# Patient Record
Sex: Female | Born: 1960
Health system: Southern US, Community
[De-identification: ages and names within clinical notes are randomized; demographics above are authoritative.]

## PROBLEM LIST (undated history)

## (undated) DIAGNOSIS — H409 Unspecified glaucoma: Secondary | ICD-10-CM

## (undated) DIAGNOSIS — T7840XA Allergy, unspecified, initial encounter: Secondary | ICD-10-CM

## (undated) HISTORY — PX: WISDOM TOOTH EXTRACTION: SHX21

## (undated) HISTORY — PX: APPENDECTOMY: SHX54

## (undated) HISTORY — DX: Allergy, unspecified, initial encounter: T78.40XA

---

## 1997-09-18 ENCOUNTER — Inpatient Hospital Stay (HOSPITAL_COMMUNITY): Admission: AD | Admit: 1997-09-18 | Discharge: 1997-09-20 | Payer: Self-pay | Admitting: Obstetrics and Gynecology

## 1999-04-16 ENCOUNTER — Other Ambulatory Visit: Admission: RE | Admit: 1999-04-16 | Discharge: 1999-04-16 | Payer: Self-pay | Admitting: Obstetrics and Gynecology

## 2000-02-07 ENCOUNTER — Ambulatory Visit (HOSPITAL_COMMUNITY): Admission: RE | Admit: 2000-02-07 | Discharge: 2000-02-07 | Payer: Self-pay | Admitting: Radiology

## 2000-02-07 ENCOUNTER — Encounter: Payer: Self-pay | Admitting: Radiology

## 2000-02-13 ENCOUNTER — Ambulatory Visit (HOSPITAL_COMMUNITY): Admission: RE | Admit: 2000-02-13 | Discharge: 2000-02-13 | Payer: Self-pay | Admitting: Obstetrics and Gynecology

## 2000-04-08 ENCOUNTER — Encounter: Payer: Self-pay | Admitting: Obstetrics and Gynecology

## 2000-04-08 ENCOUNTER — Ambulatory Visit (HOSPITAL_COMMUNITY): Admission: RE | Admit: 2000-04-08 | Discharge: 2000-04-08 | Payer: Self-pay | Admitting: Obstetrics and Gynecology

## 2000-05-20 ENCOUNTER — Other Ambulatory Visit: Admission: RE | Admit: 2000-05-20 | Discharge: 2000-05-20 | Payer: Self-pay | Admitting: Obstetrics and Gynecology

## 2001-05-25 ENCOUNTER — Ambulatory Visit (HOSPITAL_COMMUNITY): Admission: RE | Admit: 2001-05-25 | Discharge: 2001-05-25 | Payer: Self-pay | Admitting: Internal Medicine

## 2001-05-25 ENCOUNTER — Encounter: Payer: Self-pay | Admitting: Internal Medicine

## 2001-06-13 ENCOUNTER — Ambulatory Visit (HOSPITAL_COMMUNITY): Admission: RE | Admit: 2001-06-13 | Discharge: 2001-06-13 | Payer: Self-pay | Admitting: Neurology

## 2001-06-13 ENCOUNTER — Encounter: Payer: Self-pay | Admitting: Neurology

## 2001-06-15 ENCOUNTER — Other Ambulatory Visit: Admission: RE | Admit: 2001-06-15 | Discharge: 2001-06-15 | Payer: Self-pay | Admitting: Obstetrics and Gynecology

## 2002-01-28 ENCOUNTER — Ambulatory Visit (HOSPITAL_COMMUNITY): Admission: RE | Admit: 2002-01-28 | Discharge: 2002-01-28 | Payer: Self-pay | Admitting: Internal Medicine

## 2002-01-28 ENCOUNTER — Encounter: Payer: Self-pay | Admitting: Internal Medicine

## 2002-06-29 ENCOUNTER — Other Ambulatory Visit: Admission: RE | Admit: 2002-06-29 | Discharge: 2002-06-29 | Payer: Self-pay | Admitting: Obstetrics and Gynecology

## 2003-03-22 ENCOUNTER — Ambulatory Visit (HOSPITAL_COMMUNITY): Admission: RE | Admit: 2003-03-22 | Discharge: 2003-03-22 | Payer: Self-pay | Admitting: Obstetrics and Gynecology

## 2003-03-22 ENCOUNTER — Encounter: Payer: Self-pay | Admitting: Obstetrics and Gynecology

## 2003-10-24 ENCOUNTER — Other Ambulatory Visit: Admission: RE | Admit: 2003-10-24 | Discharge: 2003-10-24 | Payer: Self-pay | Admitting: Obstetrics and Gynecology

## 2004-04-25 ENCOUNTER — Ambulatory Visit (HOSPITAL_COMMUNITY): Admission: RE | Admit: 2004-04-25 | Discharge: 2004-04-25 | Payer: Self-pay | Admitting: Obstetrics and Gynecology

## 2005-06-18 ENCOUNTER — Ambulatory Visit (HOSPITAL_COMMUNITY): Admission: RE | Admit: 2005-06-18 | Discharge: 2005-06-18 | Payer: Self-pay | Admitting: Obstetrics and Gynecology

## 2005-10-18 ENCOUNTER — Ambulatory Visit: Payer: Self-pay | Admitting: Internal Medicine

## 2006-01-08 ENCOUNTER — Ambulatory Visit: Payer: Self-pay | Admitting: Internal Medicine

## 2006-07-28 ENCOUNTER — Ambulatory Visit (HOSPITAL_COMMUNITY): Admission: RE | Admit: 2006-07-28 | Discharge: 2006-07-28 | Payer: Self-pay | Admitting: Obstetrics and Gynecology

## 2007-08-26 ENCOUNTER — Ambulatory Visit (HOSPITAL_COMMUNITY): Admission: RE | Admit: 2007-08-26 | Discharge: 2007-08-26 | Payer: Self-pay | Admitting: Obstetrics and Gynecology

## 2008-05-05 ENCOUNTER — Ambulatory Visit: Payer: Self-pay | Admitting: Internal Medicine

## 2008-10-28 ENCOUNTER — Ambulatory Visit (HOSPITAL_COMMUNITY): Admission: RE | Admit: 2008-10-28 | Discharge: 2008-10-28 | Payer: Self-pay | Admitting: Obstetrics and Gynecology

## 2009-12-20 ENCOUNTER — Ambulatory Visit (HOSPITAL_COMMUNITY): Admission: RE | Admit: 2009-12-20 | Discharge: 2009-12-20 | Payer: Self-pay | Admitting: Obstetrics and Gynecology

## 2010-08-12 ENCOUNTER — Encounter: Payer: Self-pay | Admitting: Obstetrics and Gynecology

## 2010-12-07 NOTE — Op Note (Signed)
[facility]  Patient:    Jody Duncan, Jody Duncan                      MRN: [id number] Proc. Date: 02/13/00 Adm. Date:  [id number] Disc. Date: [id number] Attending:  Morene Antu                           Operative Report  PREOPERATIVE DIAGNOSIS:  Desire for sterilization.  POSTOPERATIVE DIAGNOSIS:  Desire for sterilization.  OPERATION:  Laparoscopic tubal cautery.  SURGEON:  Sherry A. Rosalio Macadamia, M.D.  ANESTHESIA:  General.  INDICATIONS:  The patient is a 50 year old G4, P4-0-0-4 woman who requests permanent sterilization procedure.  The patient understands the risks involved as well as the alternatives to the procedure and the failure rate.  FINDINGS:  Normal-sized anteflexed uterus, normal fallopian tubes and ovaries.  DESCRIPTION OF PROCEDURE:  The patient was brought to the operating room and given adequate general anesthesia.  She was placed in a dorsal lithotomy position.  Her abdomen and vagina were washed with Betadine.  A pelvic examination was performed.  Speculum was placed in the vagina.  A single toothed Hulka tenaculum was placed in the endocervical canal in anteflexed fashion.  Speculum was removed and the surgeons gown and gloves were changed. The patient was draped in sterile fashion.  Subumbilical area was infiltrated with 0.25% Marcaine.  Incision was made.  Veress needle was introduced into the peritoneal cavity.  Its placement was checked with saline.  Approximately 3L of carbon dioxide was insufflated.  The Veress needle was removed and a laparoscopic trocar was easily introduced into the peritoneal cavity.  The left superpubic incision was made after infiltrating with 0.25% Marcaine. Under direct visualization, a superpubic trocar was going to be placed but viewing this area it was seen that there were multiple blood vessels coursing across the entire anterior peritoneal surface.  It was felt that if the trocar was  placed, it was very likely that significant bleeding would occur. Therefore the decision was made to perform the tubal ligation using a single puncture technique.  Using long Kleppingers, the left fallopian tube was then grasped ____________ portion and cauterized over approximately 2 to 3 cm. There was 1 cm of normal fallopian tube between the cauterized portion of the cornua.  The same procedure was then performed on the right fallopian tube. Both fallopian tubes were definitively identified.  Both ovaries were visualized. The cul-de-sac was normal.  Upper abdomen was normal.  There were no abnormalities seen throughout the pelvis or abdomen.  Pictures were obtained.  All carbon dioxide was then allowed to escape.  The laparoscope was removed under direct visualization.  The laparoscopic sleeve was continued to be removed after all carbon dioxide had escaped.  The subumbilical incision was then closed deeply with 0 Vicryl.  The skin incisions were closed with 4-0 Vicryl in subcuticular interrupted stitches.  Band-aids were placed over the wounds.  The bladder had filled up during the surgery and therefore the bladder was in and out catheterized.  The Hulka tenaculum was removed from the vagina.  The patient was taken out of the dorsal lithotomy position.  She was awakened.  She was removed from the operating table to a stretcher in stable condition.  COMPLICATIONS:  None.  ESTIMATED BLOOD LOSS:  Less than 5 cc. DD:  02/13/00 TD:  02/15/00 Job: [id number]

## 2010-12-26 ENCOUNTER — Other Ambulatory Visit (HOSPITAL_COMMUNITY): Payer: Self-pay | Admitting: Obstetrics

## 2010-12-26 DIAGNOSIS — Z1231 Encounter for screening mammogram for malignant neoplasm of breast: Secondary | ICD-10-CM

## 2011-01-09 ENCOUNTER — Ambulatory Visit (HOSPITAL_COMMUNITY): Payer: Commercial Managed Care - PPO | Attending: Obstetrics

## 2011-01-30 ENCOUNTER — Ambulatory Visit (HOSPITAL_COMMUNITY)
Admission: RE | Admit: 2011-01-30 | Discharge: 2011-01-30 | Disposition: A | Discharge status: Home / Self Care | Payer: 59 | Source: Ambulatory Visit | Attending: Obstetrics | Admitting: Obstetrics

## 2011-01-30 DIAGNOSIS — Z1231 Encounter for screening mammogram for malignant neoplasm of breast: Secondary | ICD-10-CM | POA: Insufficient documentation

## 2011-08-12 ENCOUNTER — Other Ambulatory Visit: Payer: Self-pay | Admitting: Internal Medicine

## 2011-08-13 ENCOUNTER — Other Ambulatory Visit: Payer: Self-pay

## 2011-08-13 MED ORDER — IBUPROFEN 800 MG PO TABS: 800.0000 mg | ORAL_TABLET | Freq: Three times a day (TID) | ORAL | Status: AC | PRN

## 2011-12-31 ENCOUNTER — Other Ambulatory Visit (HOSPITAL_COMMUNITY): Payer: Self-pay | Admitting: Obstetrics

## 2011-12-31 DIAGNOSIS — Z1231 Encounter for screening mammogram for malignant neoplasm of breast: Secondary | ICD-10-CM

## 2012-01-31 ENCOUNTER — Ambulatory Visit (HOSPITAL_COMMUNITY)
Admission: RE | Admit: 2012-01-31 | Discharge: 2012-01-31 | Disposition: A | Discharge status: Home / Self Care | Payer: 59 | Source: Ambulatory Visit | Attending: Obstetrics | Admitting: Obstetrics

## 2012-01-31 DIAGNOSIS — Z1231 Encounter for screening mammogram for malignant neoplasm of breast: Secondary | ICD-10-CM | POA: Insufficient documentation

## 2012-11-11 ENCOUNTER — Other Ambulatory Visit: Payer: Self-pay | Admitting: Internal Medicine

## 2012-12-21 ENCOUNTER — Other Ambulatory Visit: Payer: Self-pay

## 2012-12-21 ENCOUNTER — Other Ambulatory Visit (HOSPITAL_COMMUNITY): Payer: Self-pay | Admitting: Obstetrics

## 2012-12-21 DIAGNOSIS — Z1231 Encounter for screening mammogram for malignant neoplasm of breast: Secondary | ICD-10-CM

## 2013-02-01 ENCOUNTER — Other Ambulatory Visit: Payer: Self-pay | Admitting: Internal Medicine

## 2013-02-01 ENCOUNTER — Ambulatory Visit
Admission: RE | Admit: 2013-02-01 | Discharge: 2013-02-01 | Disposition: A | Discharge status: Home / Self Care | Payer: 59 | Source: Ambulatory Visit

## 2013-02-01 ENCOUNTER — Ambulatory Visit
Admission: RE | Admit: 2013-02-01 | Discharge: 2013-02-01 | Disposition: A | Discharge status: Home / Self Care | Payer: 59 | Source: Ambulatory Visit | Attending: Internal Medicine | Admitting: Internal Medicine

## 2013-02-01 ENCOUNTER — Telehealth: Payer: Self-pay | Admitting: Internal Medicine

## 2013-02-01 DIAGNOSIS — N63 Unspecified lump in unspecified breast: Secondary | ICD-10-CM

## 2013-02-01 DIAGNOSIS — Z1231 Encounter for screening mammogram for malignant neoplasm of breast: Secondary | ICD-10-CM

## 2013-02-01 NOTE — Telephone Encounter (Signed)
Scheduler called and stated that the pt will need a new order before she can receive her mammogram. The pt stated that Dr. Lovell Sheehan "felt something" in her left breast, and therefor they will need a diagnostic mammogram. They will need it to state "diagnostic bi-lateral" and in the comments they will needs it state that it will need to be a 3-d diagnostic. They will also need an ultrasound of her left breast ordered. Please assist.

## 2013-02-01 NOTE — Telephone Encounter (Signed)
done

## 2013-02-03 NOTE — Progress Notes (Signed)
done

## 2013-02-03 NOTE — Progress Notes (Signed)
Left message on machine To call and discuss

## 2013-07-30 ENCOUNTER — Other Ambulatory Visit: Payer: Self-pay | Admitting: Internal Medicine

## 2013-07-30 DIAGNOSIS — N63 Unspecified lump in unspecified breast: Secondary | ICD-10-CM

## 2013-08-04 ENCOUNTER — Ambulatory Visit
Admission: RE | Admit: 2013-08-04 | Discharge: 2013-08-04 | Disposition: A | Discharge status: Home / Self Care | Payer: 59 | Source: Ambulatory Visit | Attending: Internal Medicine | Admitting: Internal Medicine

## 2013-08-04 DIAGNOSIS — N63 Unspecified lump in unspecified breast: Secondary | ICD-10-CM

## 2013-12-29 ENCOUNTER — Other Ambulatory Visit: Payer: Self-pay

## 2013-12-29 DIAGNOSIS — Z1231 Encounter for screening mammogram for malignant neoplasm of breast: Secondary | ICD-10-CM

## 2014-01-13 ENCOUNTER — Other Ambulatory Visit: Payer: Self-pay | Admitting: Internal Medicine

## 2014-02-02 ENCOUNTER — Ambulatory Visit
Admission: RE | Admit: 2014-02-02 | Discharge: 2014-02-02 | Disposition: A | Discharge status: Home / Self Care | Payer: 59 | Source: Ambulatory Visit

## 2014-02-02 DIAGNOSIS — Z1231 Encounter for screening mammogram for malignant neoplasm of breast: Secondary | ICD-10-CM

## 2014-03-22 ENCOUNTER — Other Ambulatory Visit: Payer: Self-pay | Admitting: Internal Medicine

## 2015-01-18 ENCOUNTER — Other Ambulatory Visit: Payer: Self-pay

## 2015-01-18 DIAGNOSIS — Z1231 Encounter for screening mammogram for malignant neoplasm of breast: Secondary | ICD-10-CM

## 2015-02-22 ENCOUNTER — Ambulatory Visit: Payer: 59

## 2015-04-03 ENCOUNTER — Ambulatory Visit: Payer: 59

## 2015-04-14 ENCOUNTER — Ambulatory Visit: Payer: 59

## 2015-04-14 ENCOUNTER — Ambulatory Visit
Admission: RE | Admit: 2015-04-14 | Discharge: 2015-04-14 | Disposition: A | Discharge status: Home / Self Care | Payer: 59 | Source: Ambulatory Visit

## 2015-04-14 DIAGNOSIS — Z1231 Encounter for screening mammogram for malignant neoplasm of breast: Secondary | ICD-10-CM

## 2015-08-12 ENCOUNTER — Other Ambulatory Visit: Payer: Self-pay | Admitting: Internal Medicine

## 2015-09-05 MED FILL — HYDROCHLOROTHIAZIDE 25 MG T: 25 | 90 days supply | Qty: 90 | Fill #0

## 2015-10-16 DIAGNOSIS — L821 Other seborrheic keratosis: Secondary | ICD-10-CM | POA: Diagnosis not present

## 2015-11-16 MED FILL — IBUPROFEN 800 MG TABLET: 800 | 30 days supply | Qty: 90 | Fill #0

## 2015-12-11 MED FILL — HYDROCHLOROTHIAZIDE 25 MG T: 25 | 90 days supply | Qty: 90 | Fill #1

## 2016-03-04 MED FILL — HYDROCHLOROTHIAZIDE 25 MG T: 25 | 30 days supply | Qty: 30 | Fill #0

## 2016-03-21 DIAGNOSIS — N951 Menopausal and female climacteric states: Secondary | ICD-10-CM | POA: Diagnosis not present

## 2016-03-21 DIAGNOSIS — R634 Abnormal weight loss: Secondary | ICD-10-CM | POA: Diagnosis not present

## 2016-03-21 DIAGNOSIS — L640 Drug-induced androgenic alopecia: Secondary | ICD-10-CM | POA: Diagnosis not present

## 2016-03-21 DIAGNOSIS — L659 Nonscarring hair loss, unspecified: Secondary | ICD-10-CM | POA: Diagnosis not present

## 2016-03-22 DIAGNOSIS — R002 Palpitations: Secondary | ICD-10-CM | POA: Diagnosis not present

## 2016-03-22 DIAGNOSIS — Z1389 Encounter for screening for other disorder: Secondary | ICD-10-CM | POA: Diagnosis not present

## 2016-03-22 DIAGNOSIS — N959 Unspecified menopausal and perimenopausal disorder: Secondary | ICD-10-CM | POA: Diagnosis not present

## 2016-03-22 DIAGNOSIS — Z23 Encounter for immunization: Secondary | ICD-10-CM | POA: Diagnosis not present

## 2016-03-26 ENCOUNTER — Other Ambulatory Visit: Payer: Self-pay | Admitting: Obstetrics and Gynecology

## 2016-03-26 DIAGNOSIS — Z1231 Encounter for screening mammogram for malignant neoplasm of breast: Secondary | ICD-10-CM

## 2016-04-02 MED FILL — HYDROCHLOROTHIAZIDE 25 MG T: 25 | 30 days supply | Qty: 30 | Fill #0

## 2016-04-16 ENCOUNTER — Ambulatory Visit: Payer: 59

## 2016-04-17 ENCOUNTER — Ambulatory Visit
Admission: RE | Admit: 2016-04-17 | Discharge: 2016-04-17 | Disposition: A | Discharge status: Home / Self Care | Payer: 59 | Source: Ambulatory Visit | Attending: Obstetrics and Gynecology | Admitting: Obstetrics and Gynecology

## 2016-04-17 DIAGNOSIS — Z1231 Encounter for screening mammogram for malignant neoplasm of breast: Secondary | ICD-10-CM | POA: Diagnosis not present

## 2016-04-29 MED FILL — IBUPROFEN 800 MG TABLET: 800 | 30 days supply | Qty: 90 | Fill #1

## 2016-04-29 MED FILL — HYDROCHLOROTHIAZIDE 25 MG T: 25 | 30 days supply | Qty: 30 | Fill #1

## 2016-05-27 MED FILL — HYDROCHLOROTHIAZIDE 25 MG T: 25 | 30 days supply | Qty: 30 | Fill #2

## 2016-06-24 MED FILL — HYDROCHLOROTHIAZIDE 25 MG T: 25 | 30 days supply | Qty: 30 | Fill #3

## 2016-07-23 MED FILL — HYDROCHLOROTHIAZIDE 25 MG T: 25 | 30 days supply | Qty: 30 | Fill #0

## 2016-07-26 MED FILL — IBUPROFEN 800 MG TABLET: 800 | 30 days supply | Qty: 90 | Fill #2

## 2016-08-22 MED FILL — HYDROCHLOROTHIAZIDE 25 MG T: 25 | 90 days supply | Qty: 90 | Fill #1

## 2016-09-10 DIAGNOSIS — H5203 Hypermetropia, bilateral: Secondary | ICD-10-CM | POA: Diagnosis not present

## 2016-09-10 DIAGNOSIS — H524 Presbyopia: Secondary | ICD-10-CM | POA: Diagnosis not present

## 2016-09-10 DIAGNOSIS — H52203 Unspecified astigmatism, bilateral: Secondary | ICD-10-CM | POA: Diagnosis not present

## 2016-10-14 DIAGNOSIS — M79641 Pain in right hand: Secondary | ICD-10-CM | POA: Diagnosis not present

## 2016-10-14 DIAGNOSIS — G5601 Carpal tunnel syndrome, right upper limb: Secondary | ICD-10-CM | POA: Diagnosis not present

## 2016-10-22 ENCOUNTER — Other Ambulatory Visit: Payer: Self-pay | Admitting: Internal Medicine

## 2016-10-22 MED FILL — IBUPROFEN 800 MG TABLET: 800 | 30 days supply | Qty: 90 | Fill #0

## 2016-10-24 DIAGNOSIS — G5601 Carpal tunnel syndrome, right upper limb: Secondary | ICD-10-CM | POA: Diagnosis not present

## 2016-11-18 MED FILL — HYDROCHLOROTHIAZIDE 25 MG T: 25 | 90 days supply | Qty: 90 | Fill #0

## 2016-12-06 DIAGNOSIS — Z01419 Encounter for gynecological examination (general) (routine) without abnormal findings: Secondary | ICD-10-CM | POA: Diagnosis not present

## 2016-12-06 DIAGNOSIS — Z6821 Body mass index (BMI) 21.0-21.9, adult: Secondary | ICD-10-CM | POA: Diagnosis not present

## 2017-02-13 MED FILL — IBUPROFEN 800 MG TABLET: 800 | 30 days supply | Qty: 90 | Fill #1

## 2017-02-13 MED FILL — HYDROCHLOROTHIAZIDE 25 MG T: 25 | 60 days supply | Qty: 60 | Fill #1

## 2017-03-19 ENCOUNTER — Other Ambulatory Visit: Payer: Self-pay | Admitting: Obstetrics and Gynecology

## 2017-03-19 DIAGNOSIS — Z0000 Encounter for general adult medical examination without abnormal findings: Secondary | ICD-10-CM | POA: Diagnosis not present

## 2017-03-19 DIAGNOSIS — R7309 Other abnormal glucose: Secondary | ICD-10-CM | POA: Diagnosis not present

## 2017-03-19 DIAGNOSIS — Z1231 Encounter for screening mammogram for malignant neoplasm of breast: Secondary | ICD-10-CM

## 2017-03-19 DIAGNOSIS — M859 Disorder of bone density and structure, unspecified: Secondary | ICD-10-CM | POA: Diagnosis not present

## 2017-03-19 DIAGNOSIS — Z780 Asymptomatic menopausal state: Secondary | ICD-10-CM | POA: Diagnosis not present

## 2017-03-27 DIAGNOSIS — Z0000 Encounter for general adult medical examination without abnormal findings: Secondary | ICD-10-CM | POA: Diagnosis not present

## 2017-03-27 DIAGNOSIS — Z23 Encounter for immunization: Secondary | ICD-10-CM | POA: Diagnosis not present

## 2017-03-27 DIAGNOSIS — Z1389 Encounter for screening for other disorder: Secondary | ICD-10-CM | POA: Diagnosis not present

## 2017-03-27 DIAGNOSIS — Z6821 Body mass index (BMI) 21.0-21.9, adult: Secondary | ICD-10-CM | POA: Diagnosis not present

## 2017-03-27 DIAGNOSIS — M859 Disorder of bone density and structure, unspecified: Secondary | ICD-10-CM | POA: Diagnosis not present

## 2017-04-04 DIAGNOSIS — G5601 Carpal tunnel syndrome, right upper limb: Secondary | ICD-10-CM | POA: Diagnosis not present

## 2017-04-04 DIAGNOSIS — M79641 Pain in right hand: Secondary | ICD-10-CM | POA: Diagnosis not present

## 2017-04-09 MED FILL — HYDROCHLOROTHIAZIDE 25 MG T: 25 | 30 days supply | Qty: 30 | Fill #0

## 2017-04-21 ENCOUNTER — Ambulatory Visit
Admission: RE | Admit: 2017-04-21 | Discharge: 2017-04-21 | Disposition: A | Discharge status: Home / Self Care | Payer: 59 | Source: Ambulatory Visit | Attending: Obstetrics and Gynecology | Admitting: Obstetrics and Gynecology

## 2017-04-21 DIAGNOSIS — Z1231 Encounter for screening mammogram for malignant neoplasm of breast: Secondary | ICD-10-CM | POA: Diagnosis not present

## 2017-05-09 MED FILL — HYDROCHLOROTHIAZIDE 25 MG T: 25 | 90 days supply | Qty: 90 | Fill #1

## 2017-05-20 DIAGNOSIS — D225 Melanocytic nevi of trunk: Secondary | ICD-10-CM | POA: Diagnosis not present

## 2017-05-20 DIAGNOSIS — D1801 Hemangioma of skin and subcutaneous tissue: Secondary | ICD-10-CM | POA: Diagnosis not present

## 2017-05-20 DIAGNOSIS — D235 Other benign neoplasm of skin of trunk: Secondary | ICD-10-CM | POA: Diagnosis not present

## 2017-05-20 DIAGNOSIS — L821 Other seborrheic keratosis: Secondary | ICD-10-CM | POA: Diagnosis not present

## 2017-05-26 MED FILL — IBUPROFEN 800 MG TABS: 800 | 30 days supply | Qty: 90 | Fill #2

## 2017-08-04 MED FILL — HYDROCHLOROTHIAZIDE 25 MG T: 25 | 90 days supply | Qty: 90 | Fill #0

## 2017-08-21 MED FILL — IBUPROFEN 800 MG TABS: 800 | 30 days supply | Qty: 90 | Fill #3

## 2017-09-01 DIAGNOSIS — G5601 Carpal tunnel syndrome, right upper limb: Secondary | ICD-10-CM | POA: Insufficient documentation

## 2017-09-01 DIAGNOSIS — G5602 Carpal tunnel syndrome, left upper limb: Secondary | ICD-10-CM | POA: Diagnosis not present

## 2017-10-23 MED FILL — HYDROCHLOROTHIAZIDE 25 MG T: 25 | 90 days supply | Qty: 90 | Fill #1

## 2017-10-29 DIAGNOSIS — M79641 Pain in right hand: Secondary | ICD-10-CM | POA: Insufficient documentation

## 2017-10-29 DIAGNOSIS — R200 Anesthesia of skin: Secondary | ICD-10-CM | POA: Diagnosis not present

## 2017-10-29 DIAGNOSIS — G5601 Carpal tunnel syndrome, right upper limb: Secondary | ICD-10-CM | POA: Diagnosis not present

## 2017-10-29 DIAGNOSIS — G5602 Carpal tunnel syndrome, left upper limb: Secondary | ICD-10-CM | POA: Diagnosis not present

## 2017-11-21 ENCOUNTER — Other Ambulatory Visit: Payer: Self-pay | Admitting: Internal Medicine

## 2017-11-21 MED FILL — IBUPROFEN 800 MG TAB: 800 | 30 days supply | Qty: 90 | Fill #0

## 2018-01-20 MED FILL — HYDROCHLOROTHIAZIDE 25 MG T: 25 | 30 days supply | Qty: 30 | Fill #2

## 2018-02-20 MED FILL — HYDROCHLOROTHIAZIDE 25 MG T: 25 | 90 days supply | Qty: 90 | Fill #0

## 2018-02-25 MED FILL — IBUPROFEN 800 MG TAB: 800 | 30 days supply | Qty: 90 | Fill #1

## 2018-03-09 DIAGNOSIS — Z01419 Encounter for gynecological examination (general) (routine) without abnormal findings: Secondary | ICD-10-CM | POA: Diagnosis not present

## 2018-03-09 DIAGNOSIS — Z6821 Body mass index (BMI) 21.0-21.9, adult: Secondary | ICD-10-CM | POA: Diagnosis not present

## 2018-03-18 ENCOUNTER — Ambulatory Visit (AMBULATORY_SURGERY_CENTER): Payer: Self-pay | Admitting: *Deleted

## 2018-03-18 VITALS — Ht 64.0 in | Wt 125.0 lb

## 2018-03-18 DIAGNOSIS — Z1211 Encounter for screening for malignant neoplasm of colon: Secondary | ICD-10-CM

## 2018-03-18 MED ORDER — NA SULFATE-K SULFATE-MG SULF 17.5-3.13-1.6 GM/177ML PO SOLN: ORAL | 0 refills | Status: DC

## 2018-03-18 MED FILL — SUPREP BOWEL PREP KIT: 17.5-3.13-1 | 1 days supply | Qty: 354 | Fill #0

## 2018-03-18 NOTE — Progress Notes (Signed)
No egg or soy allergy  No home oxygen used or hx of sleep apnea  No anesthesia or intubation problems  No diet medications taken 

## 2018-03-24 ENCOUNTER — Other Ambulatory Visit: Payer: Self-pay | Admitting: Obstetrics and Gynecology

## 2018-03-24 DIAGNOSIS — Z1231 Encounter for screening mammogram for malignant neoplasm of breast: Secondary | ICD-10-CM

## 2018-03-25 ENCOUNTER — Encounter: Payer: Self-pay | Admitting: Internal Medicine

## 2018-04-08 ENCOUNTER — Ambulatory Visit (AMBULATORY_SURGERY_CENTER): Payer: 59 | Admitting: Internal Medicine

## 2018-04-08 ENCOUNTER — Encounter: Payer: Self-pay | Admitting: Internal Medicine

## 2018-04-08 VITALS — BP 110/71 | HR 45 | Temp 98.6°F | Resp 16 | Ht 64.0 in | Wt 125.0 lb

## 2018-04-08 DIAGNOSIS — Z1211 Encounter for screening for malignant neoplasm of colon: Secondary | ICD-10-CM | POA: Diagnosis not present

## 2018-04-08 DIAGNOSIS — D120 Benign neoplasm of cecum: Secondary | ICD-10-CM | POA: Diagnosis not present

## 2018-04-08 DIAGNOSIS — D122 Benign neoplasm of ascending colon: Secondary | ICD-10-CM | POA: Diagnosis not present

## 2018-04-08 MED ORDER — SODIUM CHLORIDE 0.9 % IV SOLN: 500.0000 mL | Freq: Once | INTRAVENOUS | Status: AC

## 2018-04-08 NOTE — Progress Notes (Signed)
Called to room to assist during endoscopic procedure.  Patient ID and intended procedure confirmed with present staff. Received instructions for my participation in the procedure from the performing physician.  

## 2018-04-08 NOTE — Patient Instructions (Signed)
YOU HAD AN ENDOSCOPIC PROCEDURE TODAY AT [facility]:   Refer to the procedure report that was given to you for any specific questions about what was found during the examination.  If the procedure report does not answer your questions, please call your gastroenterologist to clarify.  If you requested that your care partner not be given the details of your procedure findings, then the procedure report has been included in a sealed envelope for you to review at your convenience later.  YOU SHOULD EXPECT: Some feelings of bloating in the abdomen. Passage of more gas than usual.  Walking can help get rid of the air that was put into your GI tract during the procedure and reduce the bloating. If you had a lower endoscopy (such as a colonoscopy or flexible sigmoidoscopy) you may notice spotting of blood in your stool or on the toilet paper. If you underwent a bowel prep for your procedure, you may not have a normal bowel movement for a few days.  Please Note:  You might notice some irritation and congestion in your nose or some drainage.  This is from the oxygen used during your procedure.  There is no need for concern and it should clear up in a day or so.  SYMPTOMS TO REPORT IMMEDIATELY:   Following lower endoscopy (colonoscopy or flexible sigmoidoscopy):  Excessive amounts of blood in the stool  Significant tenderness or worsening of abdominal pains  Swelling of the abdomen that is new, acute  Fever of 100F or higher  For urgent or emergent issues, a gastroenterologist can be reached at any hour by calling [phone].   DIET:  We do recommend a small meal at first, but then you may proceed to your regular diet.  Drink plenty of fluids but you should avoid alcoholic beverages for 24 hours.  ACTIVITY:  You should plan to take it easy for the rest of today and you should NOT DRIVE or use heavy machinery until tomorrow (because of the sedation medicines used during the test).     FOLLOW UP: Our staff will call the number listed on your records the next business day following your procedure to check on you and address any questions or concerns that you may have regarding the information given to you following your procedure. If we do not reach you, we will leave a message.  However, if you are feeling well and you are not experiencing any problems, there is no need to return our call.  We will assume that you have returned to your regular daily activities without incident.  If any biopsies were taken you will be contacted by phone or by letter within the next 1-3 weeks.  Please call us at [phone] if you have not heard about the biopsies in 3 weeks.    SIGNATURES/CONFIDENTIALITY: You and/or your care partner have signed paperwork which will be entered into your electronic medical record.  These signatures attest to the fact that that the information above on your After Visit Summary has been reviewed and is understood.  Full responsibility of the confidentiality of this discharge information lies with you and/or your care-partner.  Polyp and diverticulosis information given,

## 2018-04-08 NOTE — Op Note (Addendum)
[place] Patient Name: Jody Duncan Procedure Date: 04/08/2018 8:43 AM MRN: [id number] Endoscopist: Jerene Bears , MD Age: 57 Referring MD:  Date of Birth: [birth year] Gender: Female Account #: [id number] Procedure:                Colonoscopy Indications:              Screening for colorectal malignant neoplasm, This                            is the patient's first colonoscopy Medicines:                Monitored Anesthesia Care Procedure:                Pre-Anesthesia Assessment:                           - Prior to the procedure, a History and Physical                            was performed, and patient medications and                            allergies were reviewed. The patient's tolerance of                            previous anesthesia was also reviewed. The risks                            and benefits of the procedure and the sedation                            options and risks were discussed with the patient.                            All questions were answered, and informed consent                            was obtained. Prior Anticoagulants: The patient has                            taken no previous anticoagulant or antiplatelet                            agents. ASA Grade Assessment: I - A normal, healthy                            patient. After reviewing the risks and benefits,                            the patient was deemed in satisfactory condition to                            undergo the procedure.  After obtaining informed consent, the colonoscope                            was passed under direct vision. Throughout the                            procedure, the patient's blood pressure, pulse, and                            oxygen saturations were monitored continuously. The                            Colonoscope was introduced through the anus and                            advanced to the terminal ileum. The  colonoscopy was                            performed without difficulty. The patient tolerated                            the procedure well. The quality of the bowel                            preparation was excellent with SuPrep. The terminal                            ileum, ileocecal valve, appendiceal orifice, and                            rectum were photographed. Scope In: 9:03:09 AM Scope Out: 9:22:30 AM Scope Withdrawal Time: 0 hours 15 minutes 21 seconds  Total Procedure Duration: 0 hours 19 minutes 21 seconds  Findings:                 The digital rectal exam was normal.                           The terminal ileum appeared normal.                           Two sessile polyps were found in the cecum. The                            polyps were 3 to 4 mm in size. These polyps were                            removed with a cold snare. Resection and retrieval                            were complete.                           A 6 mm polyp was found in the ascending colon. The  polyp was sessile. The polyp was removed with a                            cold snare. Resection and retrieval were complete.                           A few small-mouthed diverticula were found in the                            sigmoid colon and descending colon.                           The exam was otherwise without abnormality on                            direct and retroflexion views. Complications:            No immediate complications. Estimated Blood Loss:     Estimated blood loss was minimal. Impression:               - The examined portion of the ileum was normal.                           - Two 3 to 4 mm polyps in the cecum, removed with a                            cold snare. Resected and retrieved.                           - One 6 mm polyp in the ascending colon, removed                            with a cold snare. Resected and retrieved.                            - Minimal diverticulosis in the sigmoid colon and                            in the descending colon.                           - The examination was otherwise normal on direct                            and retroflexion views. Recommendation:           - Patient has a contact number available for                            emergencies. The signs and symptoms of potential                            delayed complications were discussed with the  patient. Return to normal activities tomorrow.                            Written discharge instructions were provided to the                            patient.                           - Resume previous diet.                           - Continue present medications.                           - Await pathology results.                           - Repeat colonoscopy is recommended. The                            colonoscopy date will be determined after pathology                            results from today's exam become available for                            review. Jerene Bears, MD 04/08/2018 9:26:59 AM This report has been signed electronically.

## 2018-04-08 NOTE — Progress Notes (Signed)
To recovery, report to RN, VSS. 

## 2018-04-09 ENCOUNTER — Telehealth: Payer: Self-pay

## 2018-04-09 NOTE — Telephone Encounter (Signed)
  Follow up Call-  Call back number 04/08/2018  Post procedure Call Back phone  # [phone]  Permission to leave phone message Yes  Some recent data might be hidden     Patient questions:  Do you have a fever, pain , or abdominal swelling? Yes.   Pain Score  0 *  Have you tolerated food without any problems? Yes.    Have you been able to return to your normal activities? Yes.    Do you have any questions about your discharge instructions: Diet   No. Medications  No. Follow up visit  No.  Do you have questions or concerns about your Care? No.  Actions: * If pain score is 4 or above: No action needed, pain <4.

## 2018-04-12 ENCOUNTER — Encounter: Payer: Self-pay | Admitting: Internal Medicine

## 2018-04-18 DIAGNOSIS — Z23 Encounter for immunization: Secondary | ICD-10-CM | POA: Diagnosis not present

## 2018-04-28 ENCOUNTER — Ambulatory Visit: Payer: 59

## 2018-05-07 MED FILL — HYDROCHLOROTHIAZIDE 25 MG T: 25 | 90 days supply | Qty: 90 | Fill #1

## 2018-05-07 MED FILL — IBUPROFEN 800 MG TAB: 800 | 30 days supply | Qty: 90 | Fill #2

## 2018-05-20 DIAGNOSIS — M25521 Pain in right elbow: Secondary | ICD-10-CM | POA: Insufficient documentation

## 2018-05-20 DIAGNOSIS — M189 Osteoarthritis of first carpometacarpal joint, unspecified: Secondary | ICD-10-CM | POA: Insufficient documentation

## 2018-05-20 DIAGNOSIS — M1811 Unilateral primary osteoarthritis of first carpometacarpal joint, right hand: Secondary | ICD-10-CM | POA: Diagnosis not present

## 2018-05-20 DIAGNOSIS — G5601 Carpal tunnel syndrome, right upper limb: Secondary | ICD-10-CM | POA: Diagnosis not present

## 2018-05-20 DIAGNOSIS — G5602 Carpal tunnel syndrome, left upper limb: Secondary | ICD-10-CM | POA: Diagnosis not present

## 2018-05-25 ENCOUNTER — Ambulatory Visit: Payer: 59

## 2018-05-27 ENCOUNTER — Ambulatory Visit
Admission: RE | Admit: 2018-05-27 | Discharge: 2018-05-27 | Disposition: A | Discharge status: Home / Self Care | Payer: 59 | Source: Ambulatory Visit | Attending: Obstetrics and Gynecology | Admitting: Obstetrics and Gynecology

## 2018-05-27 DIAGNOSIS — Z1231 Encounter for screening mammogram for malignant neoplasm of breast: Secondary | ICD-10-CM

## 2018-07-03 MED FILL — IBUPROFEN 800 MG TAB: 800 | 30 days supply | Qty: 90 | Fill #3

## 2018-08-04 MED FILL — HYDROCHLOROTHIAZIDE 25 MG T: 25 | 30 days supply | Qty: 30 | Fill #2

## 2018-09-01 MED FILL — HYDROCHLOROTHIAZIDE 25 MG T: 25 | 30 days supply | Qty: 30 | Fill #0

## 2018-09-02 DIAGNOSIS — D2372 Other benign neoplasm of skin of left lower limb, including hip: Secondary | ICD-10-CM | POA: Diagnosis not present

## 2018-09-02 DIAGNOSIS — D2271 Melanocytic nevi of right lower limb, including hip: Secondary | ICD-10-CM | POA: Diagnosis not present

## 2018-09-02 DIAGNOSIS — D485 Neoplasm of uncertain behavior of skin: Secondary | ICD-10-CM | POA: Diagnosis not present

## 2018-09-02 DIAGNOSIS — L821 Other seborrheic keratosis: Secondary | ICD-10-CM | POA: Diagnosis not present

## 2018-09-02 DIAGNOSIS — D235 Other benign neoplasm of skin of trunk: Secondary | ICD-10-CM | POA: Diagnosis not present

## 2018-09-02 DIAGNOSIS — D225 Melanocytic nevi of trunk: Secondary | ICD-10-CM | POA: Diagnosis not present

## 2018-09-02 DIAGNOSIS — D2272 Melanocytic nevi of left lower limb, including hip: Secondary | ICD-10-CM | POA: Diagnosis not present

## 2018-09-02 DIAGNOSIS — L818 Other specified disorders of pigmentation: Secondary | ICD-10-CM | POA: Diagnosis not present

## 2018-09-02 DIAGNOSIS — D2261 Melanocytic nevi of right upper limb, including shoulder: Secondary | ICD-10-CM | POA: Diagnosis not present

## 2018-09-24 DIAGNOSIS — H524 Presbyopia: Secondary | ICD-10-CM | POA: Diagnosis not present

## 2018-09-24 DIAGNOSIS — H5203 Hypermetropia, bilateral: Secondary | ICD-10-CM | POA: Diagnosis not present

## 2018-09-24 DIAGNOSIS — H52203 Unspecified astigmatism, bilateral: Secondary | ICD-10-CM | POA: Diagnosis not present

## 2018-09-26 MED FILL — HYDROCHLOROTHIAZIDE 25 MG T: 25 | 30 days supply | Qty: 30 | Fill #1 | Status: TO

## 2018-10-20 MED FILL — HYDROCHLOROTHIAZIDE 25 MG T: 25 | 30 days supply | Qty: 30 | Fill #0

## 2018-10-20 MED FILL — IBUPROFEN 800 MG TAB: 800 | 30 days supply | Qty: 90 | Fill #0

## 2018-11-18 MED FILL — HYDROCHLOROTHIAZIDE 25 MG T: 25 | 30 days supply | Qty: 30 | Fill #1 | Status: TO

## 2018-12-01 DIAGNOSIS — D225 Melanocytic nevi of trunk: Secondary | ICD-10-CM | POA: Diagnosis not present

## 2018-12-01 DIAGNOSIS — L821 Other seborrheic keratosis: Secondary | ICD-10-CM | POA: Diagnosis not present

## 2018-12-01 DIAGNOSIS — L819 Disorder of pigmentation, unspecified: Secondary | ICD-10-CM | POA: Diagnosis not present

## 2018-12-01 DIAGNOSIS — L573 Poikiloderma of Civatte: Secondary | ICD-10-CM | POA: Diagnosis not present

## 2018-12-01 DIAGNOSIS — L858 Other specified epidermal thickening: Secondary | ICD-10-CM | POA: Diagnosis not present

## 2018-12-01 DIAGNOSIS — D1801 Hemangioma of skin and subcutaneous tissue: Secondary | ICD-10-CM | POA: Diagnosis not present

## 2018-12-17 MED FILL — HYDROCHLOROTHIAZIDE 25 MG T: 25 | 30 days supply | Qty: 30 | Fill #0

## 2019-01-12 ENCOUNTER — Other Ambulatory Visit: Payer: Self-pay | Admitting: Internal Medicine

## 2019-01-12 MED FILL — HYDROCHLOROTHIAZIDE 25 MG T: 25 | 60 days supply | Qty: 60 | Fill #1

## 2019-01-12 MED FILL — IBUPROFEN 800 MG TABS: 800 | 30 days supply | Qty: 90 | Fill #0

## 2019-01-12 NOTE — Telephone Encounter (Signed)
Refilled Ibuprofen

## 2019-03-08 MED FILL — HYDROCHLOROTHIAZIDE 25 MG T: 25 | 90 days supply | Qty: 90 | Fill #0

## 2019-03-31 DIAGNOSIS — B353 Tinea pedis: Secondary | ICD-10-CM | POA: Diagnosis not present

## 2019-03-31 DIAGNOSIS — L819 Disorder of pigmentation, unspecified: Secondary | ICD-10-CM | POA: Diagnosis not present

## 2019-03-31 MED FILL — TERBINAFINE HCL 250 MG TAB: 250 | 84 days supply | Qty: 21 | Fill #0

## 2019-04-08 MED FILL — IBUPROFEN 800 MG TABS: 800 | 30 days supply | Qty: 90 | Fill #1

## 2019-04-20 ENCOUNTER — Other Ambulatory Visit: Payer: Self-pay | Admitting: Internal Medicine

## 2019-04-20 DIAGNOSIS — Z1231 Encounter for screening mammogram for malignant neoplasm of breast: Secondary | ICD-10-CM

## 2019-05-13 DIAGNOSIS — Z23 Encounter for immunization: Secondary | ICD-10-CM | POA: Diagnosis not present

## 2019-05-31 MED FILL — HYDROCHLOROTHIAZIDE 25 MG T: 25 | 90 days supply | Qty: 90 | Fill #1

## 2019-06-02 ENCOUNTER — Ambulatory Visit
Admission: RE | Admit: 2019-06-02 | Discharge: 2019-06-02 | Disposition: A | Discharge status: Home / Self Care | Payer: 59 | Source: Ambulatory Visit | Attending: Internal Medicine | Admitting: Internal Medicine

## 2019-06-02 ENCOUNTER — Other Ambulatory Visit: Payer: Self-pay

## 2019-06-02 DIAGNOSIS — Z1231 Encounter for screening mammogram for malignant neoplasm of breast: Secondary | ICD-10-CM

## 2019-07-09 DIAGNOSIS — Z6821 Body mass index (BMI) 21.0-21.9, adult: Secondary | ICD-10-CM | POA: Diagnosis not present

## 2019-07-09 DIAGNOSIS — Z01419 Encounter for gynecological examination (general) (routine) without abnormal findings: Secondary | ICD-10-CM | POA: Diagnosis not present

## 2019-07-12 MED FILL — IBUPROFEN 800 MG TABS: 800 | 30 days supply | Qty: 90 | Fill #2

## 2019-07-26 MED FILL — TERBINAFINE HCL 250 MG TAB: 250 | 36 days supply | Qty: 9 | Fill #1

## 2019-08-19 MED FILL — HYDROCHLOROTHIAZIDE 25 MG T: 25 | 90 days supply | Qty: 90 | Fill #0

## 2019-09-01 MED FILL — valACYclovir HCL 1 GM TABS: 1 | 15 days supply | Qty: 30 | Fill #0

## 2019-09-07 DIAGNOSIS — B0089 Other herpesviral infection: Secondary | ICD-10-CM | POA: Diagnosis not present

## 2019-09-07 DIAGNOSIS — D2272 Melanocytic nevi of left lower limb, including hip: Secondary | ICD-10-CM | POA: Diagnosis not present

## 2019-09-07 DIAGNOSIS — D235 Other benign neoplasm of skin of trunk: Secondary | ICD-10-CM | POA: Diagnosis not present

## 2019-09-07 DIAGNOSIS — B351 Tinea unguium: Secondary | ICD-10-CM | POA: Diagnosis not present

## 2019-09-07 DIAGNOSIS — D2372 Other benign neoplasm of skin of left lower limb, including hip: Secondary | ICD-10-CM | POA: Diagnosis not present

## 2019-09-07 DIAGNOSIS — D2271 Melanocytic nevi of right lower limb, including hip: Secondary | ICD-10-CM | POA: Diagnosis not present

## 2019-09-07 DIAGNOSIS — D2262 Melanocytic nevi of left upper limb, including shoulder: Secondary | ICD-10-CM | POA: Diagnosis not present

## 2019-09-10 MED FILL — TERBINAFINE HCL 250 MG TAB: 250 | 28 days supply | Qty: 7 | Fill #0

## 2019-10-06 MED FILL — TERBINAFINE HCL 250 MG TAB: 250 | 28 days supply | Qty: 7 | Fill #1

## 2019-10-06 MED FILL — IBUPROFEN 800 MG TABS: 800 | 30 days supply | Qty: 90 | Fill #3

## 2019-10-26 MED FILL — HYDROCHLOROTHIAZIDE 25 MG T: 25 | 90 days supply | Qty: 90 | Fill #0

## 2019-11-12 DIAGNOSIS — R739 Hyperglycemia, unspecified: Secondary | ICD-10-CM | POA: Diagnosis not present

## 2019-11-12 DIAGNOSIS — M859 Disorder of bone density and structure, unspecified: Secondary | ICD-10-CM | POA: Diagnosis not present

## 2019-11-12 DIAGNOSIS — N951 Menopausal and female climacteric states: Secondary | ICD-10-CM | POA: Diagnosis not present

## 2019-11-12 DIAGNOSIS — Z0000 Encounter for general adult medical examination without abnormal findings: Secondary | ICD-10-CM | POA: Diagnosis not present

## 2019-11-12 DIAGNOSIS — Z79899 Other long term (current) drug therapy: Secondary | ICD-10-CM | POA: Diagnosis not present

## 2019-11-19 DIAGNOSIS — R82998 Other abnormal findings in urine: Secondary | ICD-10-CM | POA: Diagnosis not present

## 2019-11-19 DIAGNOSIS — Z1331 Encounter for screening for depression: Secondary | ICD-10-CM | POA: Diagnosis not present

## 2019-11-19 DIAGNOSIS — Z0000 Encounter for general adult medical examination without abnormal findings: Secondary | ICD-10-CM | POA: Diagnosis not present

## 2019-11-19 DIAGNOSIS — Z638 Other specified problems related to primary support group: Secondary | ICD-10-CM | POA: Diagnosis not present

## 2019-11-19 DIAGNOSIS — R7301 Impaired fasting glucose: Secondary | ICD-10-CM | POA: Diagnosis not present

## 2019-11-19 DIAGNOSIS — K635 Polyp of colon: Secondary | ICD-10-CM | POA: Diagnosis not present

## 2019-11-19 DIAGNOSIS — M8580 Other specified disorders of bone density and structure, unspecified site: Secondary | ICD-10-CM | POA: Diagnosis not present

## 2019-12-21 MED FILL — TERBINAFINE HCL 250 MG TAB: 250 | 28 days supply | Qty: 7 | Fill #2

## 2020-01-11 DIAGNOSIS — H524 Presbyopia: Secondary | ICD-10-CM | POA: Diagnosis not present

## 2020-01-11 DIAGNOSIS — H52203 Unspecified astigmatism, bilateral: Secondary | ICD-10-CM | POA: Diagnosis not present

## 2020-01-11 DIAGNOSIS — H5203 Hypermetropia, bilateral: Secondary | ICD-10-CM | POA: Diagnosis not present

## 2020-01-11 MED FILL — IBUPROFEN 800 MG TABS: 800 | 30 days supply | Qty: 90 | Fill #4

## 2020-02-23 MED FILL — TERBINAFINE HCL 250 MG TAB: 250 | 28 days supply | Qty: 7 | Fill #3

## 2020-02-25 ENCOUNTER — Other Ambulatory Visit (HOSPITAL_COMMUNITY): Payer: Self-pay | Admitting: Internal Medicine

## 2020-03-20 MED FILL — HYDROCHLOROTHIAZIDE 25 MG T: 25 | 90 days supply | Qty: 90 | Fill #0

## 2020-04-01 ENCOUNTER — Other Ambulatory Visit: Payer: Self-pay | Admitting: Internal Medicine

## 2020-04-18 ENCOUNTER — Other Ambulatory Visit (HOSPITAL_COMMUNITY): Payer: Self-pay | Admitting: Internal Medicine

## 2020-04-18 MED FILL — FLUARIX QUADRIVALENT 0.5 ML: 0.5 | 1 days supply | Qty: 1 | Fill #0

## 2020-04-20 ENCOUNTER — Other Ambulatory Visit: Payer: Self-pay | Admitting: Internal Medicine

## 2020-04-20 DIAGNOSIS — Z1231 Encounter for screening mammogram for malignant neoplasm of breast: Secondary | ICD-10-CM

## 2020-05-09 MED FILL — SHINGRIX 50 MCG SUS: 50 | 1 days supply | Qty: 1 | Fill #0

## 2020-05-31 MED FILL — HYDROCHLOROTHIAZIDE 25 MG T: 25 | 90 days supply | Qty: 90 | Fill #1

## 2020-06-02 ENCOUNTER — Ambulatory Visit: Payer: 59

## 2020-06-06 ENCOUNTER — Ambulatory Visit
Admission: RE | Admit: 2020-06-06 | Discharge: 2020-06-06 | Disposition: A | Discharge status: Home / Self Care | Payer: 59 | Source: Ambulatory Visit | Attending: Internal Medicine | Admitting: Internal Medicine

## 2020-06-06 ENCOUNTER — Other Ambulatory Visit: Payer: Self-pay

## 2020-06-06 DIAGNOSIS — Z1231 Encounter for screening mammogram for malignant neoplasm of breast: Secondary | ICD-10-CM

## 2020-07-27 MED FILL — SHINGRIX 50 MCG SUS: 50 | 1 days supply | Qty: 1 | Fill #1

## 2020-08-25 MED FILL — HYDROCHLOROTHIAZIDE 25 MG T: 25 | 90 days supply | Qty: 90 | Fill #2

## 2020-09-13 ENCOUNTER — Other Ambulatory Visit: Payer: Self-pay | Admitting: Internal Medicine

## 2020-09-28 DIAGNOSIS — M79645 Pain in left finger(s): Secondary | ICD-10-CM | POA: Insufficient documentation

## 2020-09-28 DIAGNOSIS — M1811 Unilateral primary osteoarthritis of first carpometacarpal joint, right hand: Secondary | ICD-10-CM | POA: Diagnosis not present

## 2020-09-28 DIAGNOSIS — M79644 Pain in right finger(s): Secondary | ICD-10-CM | POA: Insufficient documentation

## 2020-09-28 DIAGNOSIS — G5601 Carpal tunnel syndrome, right upper limb: Secondary | ICD-10-CM | POA: Diagnosis not present

## 2020-10-02 ENCOUNTER — Other Ambulatory Visit (HOSPITAL_COMMUNITY): Payer: Self-pay | Admitting: Dermatology

## 2020-10-09 ENCOUNTER — Other Ambulatory Visit (HOSPITAL_COMMUNITY): Payer: Self-pay | Admitting: Dermatology

## 2020-10-09 MED FILL — TERBINAFINE HCL 250 MG TAB: 250 | 7 days supply | Qty: 7 | Fill #0

## 2020-10-24 DIAGNOSIS — D2271 Melanocytic nevi of right lower limb, including hip: Secondary | ICD-10-CM | POA: Diagnosis not present

## 2020-10-24 DIAGNOSIS — D225 Melanocytic nevi of trunk: Secondary | ICD-10-CM | POA: Diagnosis not present

## 2020-10-24 DIAGNOSIS — B351 Tinea unguium: Secondary | ICD-10-CM | POA: Diagnosis not present

## 2020-10-24 DIAGNOSIS — L821 Other seborrheic keratosis: Secondary | ICD-10-CM | POA: Diagnosis not present

## 2020-10-24 DIAGNOSIS — D2372 Other benign neoplasm of skin of left lower limb, including hip: Secondary | ICD-10-CM | POA: Diagnosis not present

## 2020-10-24 DIAGNOSIS — D2261 Melanocytic nevi of right upper limb, including shoulder: Secondary | ICD-10-CM | POA: Diagnosis not present

## 2020-11-15 DIAGNOSIS — R7301 Impaired fasting glucose: Secondary | ICD-10-CM | POA: Diagnosis not present

## 2020-11-15 DIAGNOSIS — Z0000 Encounter for general adult medical examination without abnormal findings: Secondary | ICD-10-CM | POA: Diagnosis not present

## 2020-11-15 DIAGNOSIS — M859 Disorder of bone density and structure, unspecified: Secondary | ICD-10-CM | POA: Diagnosis not present

## 2020-11-20 ENCOUNTER — Other Ambulatory Visit (HOSPITAL_COMMUNITY): Payer: Self-pay | Admitting: Internal Medicine

## 2020-11-20 ENCOUNTER — Other Ambulatory Visit (HOSPITAL_COMMUNITY): Payer: Self-pay

## 2020-11-21 ENCOUNTER — Other Ambulatory Visit (HOSPITAL_COMMUNITY): Payer: Self-pay

## 2020-11-22 ENCOUNTER — Other Ambulatory Visit (HOSPITAL_COMMUNITY): Payer: Self-pay | Admitting: Internal Medicine

## 2020-11-22 ENCOUNTER — Other Ambulatory Visit (HOSPITAL_COMMUNITY): Payer: Self-pay

## 2020-11-24 ENCOUNTER — Other Ambulatory Visit (HOSPITAL_COMMUNITY): Payer: Self-pay

## 2020-11-24 ENCOUNTER — Other Ambulatory Visit (HOSPITAL_COMMUNITY): Payer: Self-pay | Admitting: Internal Medicine

## 2020-11-27 ENCOUNTER — Other Ambulatory Visit (HOSPITAL_COMMUNITY): Payer: Self-pay

## 2020-11-27 MED ORDER — TERBINAFINE HCL 250 MG PO TABS
250.0000 mg | ORAL_TABLET | ORAL | 0 refills | Status: AC
  Filled 2020-11-27: qty 1, 60d supply, fill #0
  Filled 2021-01-30: qty 1, 60d supply, fill #1
  Filled 2021-03-27: qty 1, 60d supply, fill #2
  Filled 2021-05-16: qty 1, 60d supply, fill #3
  Filled 2021-07-25: qty 1, 60d supply, fill #4
  Filled 2021-10-02: qty 1, 60d supply, fill #5

## 2020-11-29 ENCOUNTER — Other Ambulatory Visit (HOSPITAL_COMMUNITY): Payer: Self-pay | Admitting: Internal Medicine

## 2020-11-29 ENCOUNTER — Other Ambulatory Visit (HOSPITAL_COMMUNITY): Payer: Self-pay

## 2020-11-29 DIAGNOSIS — E785 Hyperlipidemia, unspecified: Secondary | ICD-10-CM | POA: Diagnosis not present

## 2020-11-29 DIAGNOSIS — R82998 Other abnormal findings in urine: Secondary | ICD-10-CM | POA: Diagnosis not present

## 2020-11-29 DIAGNOSIS — Z23 Encounter for immunization: Secondary | ICD-10-CM | POA: Diagnosis not present

## 2020-11-29 DIAGNOSIS — M8580 Other specified disorders of bone density and structure, unspecified site: Secondary | ICD-10-CM | POA: Diagnosis not present

## 2020-11-29 DIAGNOSIS — R7301 Impaired fasting glucose: Secondary | ICD-10-CM | POA: Diagnosis not present

## 2020-11-29 DIAGNOSIS — Z0000 Encounter for general adult medical examination without abnormal findings: Secondary | ICD-10-CM | POA: Diagnosis not present

## 2020-11-29 DIAGNOSIS — G5601 Carpal tunnel syndrome, right upper limb: Secondary | ICD-10-CM | POA: Diagnosis not present

## 2020-11-30 ENCOUNTER — Other Ambulatory Visit (HOSPITAL_COMMUNITY): Payer: Self-pay

## 2020-11-30 MED ORDER — HYDROCHLOROTHIAZIDE 25 MG PO TABS
25.0000 mg | ORAL_TABLET | Freq: Every day | ORAL | 3 refills | Status: DC
  Filled 2020-11-30: qty 90, 90d supply, fill #0
  Filled 2021-02-23: qty 90, 90d supply, fill #1
  Filled 2021-05-16: qty 90, 90d supply, fill #2
  Filled 2021-08-08: qty 90, 90d supply, fill #3

## 2020-12-01 ENCOUNTER — Other Ambulatory Visit (HOSPITAL_COMMUNITY): Payer: Self-pay

## 2020-12-05 ENCOUNTER — Other Ambulatory Visit: Payer: Self-pay | Admitting: Internal Medicine

## 2020-12-28 ENCOUNTER — Other Ambulatory Visit (HOSPITAL_COMMUNITY): Payer: Self-pay

## 2021-01-30 ENCOUNTER — Other Ambulatory Visit (HOSPITAL_COMMUNITY): Payer: Self-pay

## 2021-01-31 ENCOUNTER — Other Ambulatory Visit (HOSPITAL_COMMUNITY): Payer: Self-pay

## 2021-02-01 DIAGNOSIS — Z01419 Encounter for gynecological examination (general) (routine) without abnormal findings: Secondary | ICD-10-CM | POA: Diagnosis not present

## 2021-02-01 DIAGNOSIS — Z6822 Body mass index (BMI) 22.0-22.9, adult: Secondary | ICD-10-CM | POA: Diagnosis not present

## 2021-02-15 DIAGNOSIS — R058 Other specified cough: Secondary | ICD-10-CM | POA: Diagnosis not present

## 2021-02-15 DIAGNOSIS — R0981 Nasal congestion: Secondary | ICD-10-CM | POA: Diagnosis not present

## 2021-02-15 DIAGNOSIS — R5383 Other fatigue: Secondary | ICD-10-CM | POA: Diagnosis not present

## 2021-02-15 DIAGNOSIS — J029 Acute pharyngitis, unspecified: Secondary | ICD-10-CM | POA: Diagnosis not present

## 2021-02-15 DIAGNOSIS — J018 Other acute sinusitis: Secondary | ICD-10-CM | POA: Diagnosis not present

## 2021-02-15 DIAGNOSIS — Z1152 Encounter for screening for COVID-19: Secondary | ICD-10-CM | POA: Diagnosis not present

## 2021-02-23 ENCOUNTER — Other Ambulatory Visit (HOSPITAL_COMMUNITY): Payer: Self-pay

## 2021-03-27 ENCOUNTER — Other Ambulatory Visit (HOSPITAL_COMMUNITY): Payer: Self-pay

## 2021-03-28 ENCOUNTER — Other Ambulatory Visit (HOSPITAL_COMMUNITY): Payer: Self-pay

## 2021-04-12 DIAGNOSIS — H52203 Unspecified astigmatism, bilateral: Secondary | ICD-10-CM | POA: Diagnosis not present

## 2021-04-12 DIAGNOSIS — H40003 Preglaucoma, unspecified, bilateral: Secondary | ICD-10-CM | POA: Diagnosis not present

## 2021-04-12 DIAGNOSIS — H5203 Hypermetropia, bilateral: Secondary | ICD-10-CM | POA: Diagnosis not present

## 2021-04-19 ENCOUNTER — Other Ambulatory Visit (HOSPITAL_COMMUNITY): Payer: Self-pay

## 2021-05-12 DIAGNOSIS — Z23 Encounter for immunization: Secondary | ICD-10-CM | POA: Diagnosis not present

## 2021-05-14 ENCOUNTER — Other Ambulatory Visit: Payer: Self-pay | Admitting: Obstetrics and Gynecology

## 2021-05-14 DIAGNOSIS — Z1231 Encounter for screening mammogram for malignant neoplasm of breast: Secondary | ICD-10-CM

## 2021-05-16 ENCOUNTER — Other Ambulatory Visit (HOSPITAL_COMMUNITY): Payer: Self-pay

## 2021-05-16 DIAGNOSIS — M1811 Unilateral primary osteoarthritis of first carpometacarpal joint, right hand: Secondary | ICD-10-CM | POA: Diagnosis not present

## 2021-05-16 DIAGNOSIS — G5601 Carpal tunnel syndrome, right upper limb: Secondary | ICD-10-CM | POA: Diagnosis not present

## 2021-06-18 ENCOUNTER — Ambulatory Visit
Admission: RE | Admit: 2021-06-18 | Discharge: 2021-06-18 | Disposition: A | Discharge status: Home / Self Care | Payer: 59 | Source: Ambulatory Visit | Attending: Obstetrics and Gynecology | Admitting: Obstetrics and Gynecology

## 2021-06-18 DIAGNOSIS — Z1231 Encounter for screening mammogram for malignant neoplasm of breast: Secondary | ICD-10-CM

## 2021-07-09 ENCOUNTER — Other Ambulatory Visit (HOSPITAL_COMMUNITY): Payer: Self-pay

## 2021-07-09 DIAGNOSIS — H401131 Primary open-angle glaucoma, bilateral, mild stage: Secondary | ICD-10-CM | POA: Diagnosis not present

## 2021-07-09 MED ORDER — LATANOPROST 0.005 % OP SOLN
1.0000 [drp] | Freq: Every evening | OPHTHALMIC | 3 refills | Status: DC
  Filled 2021-07-09: qty 2.5, 25d supply, fill #0
  Filled 2021-10-02: qty 2.5, 25d supply, fill #1
  Filled 2022-01-29: qty 2.5, 25d supply, fill #2
  Filled 2022-06-25: qty 2.5, 25d supply, fill #3

## 2021-07-17 ENCOUNTER — Other Ambulatory Visit (HOSPITAL_COMMUNITY): Payer: Self-pay

## 2021-07-25 ENCOUNTER — Other Ambulatory Visit (HOSPITAL_COMMUNITY): Payer: Self-pay

## 2021-08-08 ENCOUNTER — Other Ambulatory Visit (HOSPITAL_COMMUNITY): Payer: Self-pay

## 2021-08-09 ENCOUNTER — Other Ambulatory Visit (HOSPITAL_COMMUNITY): Payer: Self-pay

## 2021-08-09 DIAGNOSIS — H401131 Primary open-angle glaucoma, bilateral, mild stage: Secondary | ICD-10-CM | POA: Diagnosis not present

## 2021-09-26 DIAGNOSIS — M1811 Unilateral primary osteoarthritis of first carpometacarpal joint, right hand: Secondary | ICD-10-CM | POA: Diagnosis not present

## 2021-09-26 DIAGNOSIS — M79644 Pain in right finger(s): Secondary | ICD-10-CM | POA: Diagnosis not present

## 2021-10-02 ENCOUNTER — Other Ambulatory Visit (HOSPITAL_COMMUNITY): Payer: Self-pay

## 2021-10-02 MED ORDER — HYDROCHLOROTHIAZIDE 25 MG PO TABS
25.0000 mg | ORAL_TABLET | Freq: Every morning | ORAL | 3 refills | Status: DC
  Filled 2021-10-02 – 2021-11-05 (×2): qty 90, 90d supply, fill #0
  Filled 2022-01-24 – 2022-01-28 (×2): qty 90, 90d supply, fill #1
  Filled 2022-04-22: qty 90, 90d supply, fill #2
  Filled 2022-07-12: qty 90, 90d supply, fill #3

## 2021-10-03 ENCOUNTER — Other Ambulatory Visit (HOSPITAL_COMMUNITY): Payer: Self-pay

## 2021-11-01 DIAGNOSIS — D225 Melanocytic nevi of trunk: Secondary | ICD-10-CM | POA: Diagnosis not present

## 2021-11-01 DIAGNOSIS — L821 Other seborrheic keratosis: Secondary | ICD-10-CM | POA: Diagnosis not present

## 2021-11-01 DIAGNOSIS — D2372 Other benign neoplasm of skin of left lower limb, including hip: Secondary | ICD-10-CM | POA: Diagnosis not present

## 2021-11-05 ENCOUNTER — Other Ambulatory Visit (HOSPITAL_COMMUNITY): Payer: Self-pay

## 2021-11-08 DIAGNOSIS — H401131 Primary open-angle glaucoma, bilateral, mild stage: Secondary | ICD-10-CM | POA: Diagnosis not present

## 2022-01-14 DIAGNOSIS — M1811 Unilateral primary osteoarthritis of first carpometacarpal joint, right hand: Secondary | ICD-10-CM | POA: Diagnosis not present

## 2022-01-14 DIAGNOSIS — M79644 Pain in right finger(s): Secondary | ICD-10-CM | POA: Diagnosis not present

## 2022-01-17 ENCOUNTER — Other Ambulatory Visit: Payer: Self-pay

## 2022-01-24 ENCOUNTER — Other Ambulatory Visit (HOSPITAL_COMMUNITY): Payer: Self-pay

## 2022-01-24 MED ORDER — TERBINAFINE HCL 250 MG PO TABS
250.0000 mg | ORAL_TABLET | ORAL | 0 refills | Status: AC
  Filled 2022-01-24: qty 7, 60d supply, fill #0
  Filled 2022-03-26: qty 7, 60d supply, fill #1
  Filled 2022-07-12: qty 7, 60d supply, fill #2

## 2022-01-25 ENCOUNTER — Other Ambulatory Visit (HOSPITAL_COMMUNITY): Payer: Self-pay

## 2022-01-28 ENCOUNTER — Other Ambulatory Visit (HOSPITAL_COMMUNITY): Payer: Self-pay

## 2022-01-29 ENCOUNTER — Other Ambulatory Visit (HOSPITAL_COMMUNITY): Payer: Self-pay

## 2022-02-19 DIAGNOSIS — R7989 Other specified abnormal findings of blood chemistry: Secondary | ICD-10-CM | POA: Diagnosis not present

## 2022-02-19 DIAGNOSIS — R7301 Impaired fasting glucose: Secondary | ICD-10-CM | POA: Diagnosis not present

## 2022-02-19 DIAGNOSIS — M8589 Other specified disorders of bone density and structure, multiple sites: Secondary | ICD-10-CM | POA: Diagnosis not present

## 2022-02-19 DIAGNOSIS — E785 Hyperlipidemia, unspecified: Secondary | ICD-10-CM | POA: Diagnosis not present

## 2022-02-27 DIAGNOSIS — E785 Hyperlipidemia, unspecified: Secondary | ICD-10-CM | POA: Diagnosis not present

## 2022-02-27 DIAGNOSIS — M19031 Primary osteoarthritis, right wrist: Secondary | ICD-10-CM | POA: Diagnosis not present

## 2022-02-27 DIAGNOSIS — Z0000 Encounter for general adult medical examination without abnormal findings: Secondary | ICD-10-CM | POA: Diagnosis not present

## 2022-02-27 DIAGNOSIS — Z1331 Encounter for screening for depression: Secondary | ICD-10-CM | POA: Diagnosis not present

## 2022-02-27 DIAGNOSIS — N951 Menopausal and female climacteric states: Secondary | ICD-10-CM | POA: Diagnosis not present

## 2022-02-27 DIAGNOSIS — R82998 Other abnormal findings in urine: Secondary | ICD-10-CM | POA: Diagnosis not present

## 2022-02-27 DIAGNOSIS — H409 Unspecified glaucoma: Secondary | ICD-10-CM | POA: Diagnosis not present

## 2022-02-27 DIAGNOSIS — M8580 Other specified disorders of bone density and structure, unspecified site: Secondary | ICD-10-CM | POA: Diagnosis not present

## 2022-02-27 DIAGNOSIS — Z1339 Encounter for screening examination for other mental health and behavioral disorders: Secondary | ICD-10-CM | POA: Diagnosis not present

## 2022-02-27 DIAGNOSIS — R7301 Impaired fasting glucose: Secondary | ICD-10-CM | POA: Diagnosis not present

## 2022-03-04 ENCOUNTER — Other Ambulatory Visit (HOSPITAL_COMMUNITY): Payer: Self-pay

## 2022-03-04 DIAGNOSIS — L253 Unspecified contact dermatitis due to other chemical products: Secondary | ICD-10-CM | POA: Diagnosis not present

## 2022-03-04 DIAGNOSIS — L738 Other specified follicular disorders: Secondary | ICD-10-CM | POA: Diagnosis not present

## 2022-03-04 MED ORDER — TRIAMCINOLONE ACETONIDE 0.1 % EX CREA
1.0000 | TOPICAL_CREAM | Freq: Two times a day (BID) | CUTANEOUS | 0 refills | Status: AC
  Filled 2022-03-04: qty 80, 40d supply, fill #0

## 2022-03-14 ENCOUNTER — Other Ambulatory Visit: Payer: Self-pay | Admitting: *Deleted

## 2022-03-14 DIAGNOSIS — I8393 Asymptomatic varicose veins of bilateral lower extremities: Secondary | ICD-10-CM

## 2022-03-26 ENCOUNTER — Ambulatory Visit (HOSPITAL_COMMUNITY)
Admission: RE | Admit: 2022-03-26 | Discharge: 2022-03-26 | Disposition: A | Discharge status: Home / Self Care | Payer: 59 | Source: Ambulatory Visit | Attending: Vascular Surgery | Admitting: Vascular Surgery

## 2022-03-26 ENCOUNTER — Other Ambulatory Visit (HOSPITAL_COMMUNITY): Payer: Self-pay

## 2022-03-26 DIAGNOSIS — I8393 Asymptomatic varicose veins of bilateral lower extremities: Secondary | ICD-10-CM | POA: Diagnosis not present

## 2022-03-27 ENCOUNTER — Ambulatory Visit: Payer: 59 | Admitting: Physician Assistant

## 2022-03-27 VITALS — BP 127/87 | HR 59 | Temp 98.3°F | Resp 20 | Ht 64.0 in | Wt 132.2 lb

## 2022-03-27 DIAGNOSIS — I8001 Phlebitis and thrombophlebitis of superficial vessels of right lower extremity: Secondary | ICD-10-CM

## 2022-03-27 DIAGNOSIS — I8393 Asymptomatic varicose veins of bilateral lower extremities: Secondary | ICD-10-CM

## 2022-03-27 DIAGNOSIS — H409 Unspecified glaucoma: Secondary | ICD-10-CM | POA: Insufficient documentation

## 2022-03-27 NOTE — Progress Notes (Signed)
Office Note     CC:  follow up Requesting Provider:  Ginger Organ., MD  HPI: Jody Duncan is a 60 y.o. 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  She describes a painful episode with redness and protruding veins in her proximal right calf.  This resolves with NSAIDs and a cold compress.  She had another painful episode with redness in the same area 1 month ago.  She denies any history of DVT, venous ulcerations, trauma, or prior vascular intervention.  She is very active and does some form of cardio every day.  She has compression stockings however does not wear them.  She does not make an effort to elevate her legs during the day as she does not experience much swelling.  She denies tobacco use.  She has history of sclerotherapy in the right lower extremity years ago at an outside facility.  Patient's mother died at a young age however she remembers that she had large varicose veins.   Past Medical History:  Diagnosis Date   Allergy    dust, animal hair    Past Surgical History:  Procedure Laterality Date   APPENDECTOMY     WISDOM TOOTH EXTRACTION      Social History   Socioeconomic History   Marital status: Married    Spouse name: Not on file   Number of children: Not on file   Years of education: Not on file   Highest education level: Not on file  Occupational History   Not on file  Tobacco Use   Smoking status: Never    Passive exposure: Never   Smokeless tobacco: Never  Vaping Use   Vaping Use: Never used  Substance and Sexual Activity   Alcohol use: Yes    Comment: occasional   Drug use: Never   Sexual activity: Not on file  Other Topics Concern   Not on file  Social History Narrative   Not on file   Social Determinants of Health   Financial Resource Strain: Not on file  Food Insecurity: Not on file  Transportation Needs: Not on file  Physical Activity: Not on file  Stress: Not on file  Social  Connections: Not on file  Intimate Partner Violence: Not on file    Family History  Problem Relation Age of Onset   Breast cancer Neg Hx    Colon cancer Neg Hx    Esophageal cancer Neg Hx    Stomach cancer Neg Hx    Rectal cancer Neg Hx     Current Outpatient Medications  Medication Sig Dispense Refill   BIOTIN PO Take by mouth daily.     Cholecalciferol (VITAMIN D3 PO) daily.     hydrochlorothiazide (HYDRODIURIL) 25 MG tablet Take 25 mg by mouth daily as needed.  6   hydrochlorothiazide (HYDRODIURIL) 25 MG tablet Take 1 tablet (25 mg total) by mouth in the morning as needed 90 tablet 3   ibuprofen (ADVIL) 800 MG tablet TAKE 1 TABLET BY MOUTH 3 TIMES DAILY AS NEEDED 90 tablet PRN   latanoprost (XALATAN) 0.005 % ophthalmic solution Place 1 drop into both eyes nightly 2.5 mL 3   MAGNESIUM PO Take by mouth daily.     Omega-3 Fatty Acids (OMEGA 3 PO) Take by mouth daily.     Progesterone Micronized (PROGESTERONE COMPOUNDING KIT TD) Place onto the skin. Applies to skin twice daily     Pyridoxine HCl (VITAMIN B-6 PO) Take by mouth  daily.     terbinafine (LAMISIL) 250 MG tablet Take 1 tablet (250 mg total) by mouth once every other month 7 tablet 0   terbinafine (LAMISIL) 250 MG tablet Take 1 tablet (250 mg total) by mouth daily for 7 days every other month. 30 tablet 0   triamcinolone cream (KENALOG) 0.1 % Apply 1 Application topically 2 (two) times daily to areas fo rash for 1-2 weeks until completely smooth. 80 g 0   TURMERIC PO Take by mouth daily.     VITAMIN E PO Take by mouth daily.     hydrochlorothiazide (HYDRODIURIL) 25 MG tablet TAKE 1 TABLET BY MOUTH ONCE DAILY AS NEEDED 90 tablet 2   Current Facility-Administered Medications  Medication Dose Route Frequency Provider Last Rate Last Admin   0.9 %  sodium chloride infusion  500 mL Intravenous Once Pyrtle, Lajuan Lines, MD        Allergies  Allergen Reactions   Dust Mite Extract Other (See Comments)   Mite (D. Farinae)       REVIEW OF SYSTEMS:   [X] denotes positive finding, [ ] denotes negative finding Cardiac  Comments:  Chest pain or chest pressure:    Shortness of breath upon exertion:    Short of breath when lying flat:    Irregular heart rhythm:        Vascular    Pain in calf, thigh, or hip brought on by ambulation:    Pain in feet at night that wakes you up from your sleep:     Blood clot in your veins:    Leg swelling:         Pulmonary    Oxygen at home:    Productive cough:     Wheezing:         Neurologic    Sudden weakness in arms or legs:     Sudden numbness in arms or legs:     Sudden onset of difficulty speaking or slurred speech:    Temporary loss of vision in one eye:     Problems with dizziness:         Gastrointestinal    Blood in stool:     Vomited blood:         Genitourinary    Burning when urinating:     Blood in urine:        Psychiatric    Major depression:         Hematologic    Bleeding problems:    Problems with blood clotting too easily:        Skin    Rashes or ulcers:        Constitutional    Fever or chills:      PHYSICAL EXAMINATION:  Vitals:   03/27/22 1246  BP: 127/87  Pulse: (!) 59  Resp: 20  Temp: 98.3 F (36.8 C)  TempSrc: Temporal  SpO2: 99%  Weight: 132 lb 3.2 oz (60 kg)  Height: 5' 4" (1.626 m)    General:  WDWN in NAD; vital signs documented above Gait: Not observed HENT: WNL, normocephalic Pulmonary: normal non-labored breathing , without Rales, rhonchi,  wheezing Cardiac: regular HR Abdomen: soft, NT, no masses Skin: without rashes Vascular Exam/Pulses:  Right Left  DP 2+ (normal) 2+ (normal)   Extremities: Varicose veins of the right proximal calf without pain or erythema Musculoskeletal: no muscle wasting or atrophy  Neurologic: A&O X 3;  No focal weakness or paresthesias are detected Psychiatric:  The  pt has Normal affect.   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  The diameter of the GSV was less than 4 mm thus she would not be a candidate for laser ablation therapy. -Recommendations included compression 15 to 20 mmHg knee-high to be worn daily.  She should also periodically elevate her legs during the day.  She should also make an effort to avoid prolonged sitting and standing. -She was evaluated by Estell Harpin, RN who offered her sclerotherapy of the symptomatic varicosities.  The patient is agreeable to proceed and will call to schedule this in the future -She will otherwise follow-up in 1 year to repeat venous reflux study.  She knows she is at risk for subsequent superficial thrombophlebitis events   Dagoberto Ligas, PA-C [facility] [phone]  Clinic MD:   Donzetta Matters

## 2022-03-29 ENCOUNTER — Other Ambulatory Visit: Payer: Self-pay

## 2022-03-29 DIAGNOSIS — I8393 Asymptomatic varicose veins of bilateral lower extremities: Secondary | ICD-10-CM

## 2022-04-18 ENCOUNTER — Other Ambulatory Visit (HOSPITAL_COMMUNITY): Payer: Self-pay

## 2022-04-18 DIAGNOSIS — H52203 Unspecified astigmatism, bilateral: Secondary | ICD-10-CM | POA: Diagnosis not present

## 2022-04-18 DIAGNOSIS — H401131 Primary open-angle glaucoma, bilateral, mild stage: Secondary | ICD-10-CM | POA: Diagnosis not present

## 2022-04-18 DIAGNOSIS — H5203 Hypermetropia, bilateral: Secondary | ICD-10-CM | POA: Diagnosis not present

## 2022-04-22 ENCOUNTER — Other Ambulatory Visit (HOSPITAL_COMMUNITY): Payer: Self-pay

## 2022-05-18 DIAGNOSIS — Z23 Encounter for immunization: Secondary | ICD-10-CM | POA: Diagnosis not present

## 2022-05-29 DIAGNOSIS — M1811 Unilateral primary osteoarthritis of first carpometacarpal joint, right hand: Secondary | ICD-10-CM | POA: Diagnosis not present

## 2022-05-29 DIAGNOSIS — R52 Pain, unspecified: Secondary | ICD-10-CM | POA: Diagnosis not present

## 2022-05-29 DIAGNOSIS — M79644 Pain in right finger(s): Secondary | ICD-10-CM | POA: Diagnosis not present

## 2022-06-04 ENCOUNTER — Other Ambulatory Visit: Payer: Self-pay | Admitting: Internal Medicine

## 2022-06-04 DIAGNOSIS — Z1231 Encounter for screening mammogram for malignant neoplasm of breast: Secondary | ICD-10-CM

## 2022-06-25 ENCOUNTER — Other Ambulatory Visit (HOSPITAL_COMMUNITY): Payer: Self-pay

## 2022-07-04 ENCOUNTER — Ambulatory Visit: Payer: 59

## 2022-07-12 ENCOUNTER — Other Ambulatory Visit (HOSPITAL_COMMUNITY): Payer: Self-pay

## 2022-07-19 DIAGNOSIS — H401131 Primary open-angle glaucoma, bilateral, mild stage: Secondary | ICD-10-CM | POA: Diagnosis not present

## 2022-07-23 ENCOUNTER — Other Ambulatory Visit (HOSPITAL_COMMUNITY): Payer: Self-pay

## 2022-07-23 MED ORDER — TERBINAFINE HCL 250 MG PO TABS
250.0000 mg | ORAL_TABLET | Freq: Every day | ORAL | 2 refills | Status: AC
  Filled 2022-07-23 – 2022-07-25 (×2): qty 30, 30d supply, fill #0
  Filled 2022-09-04: qty 30, 30d supply, fill #1
  Filled 2022-11-04: qty 30, 30d supply, fill #2

## 2022-07-24 ENCOUNTER — Other Ambulatory Visit (HOSPITAL_COMMUNITY): Payer: Self-pay

## 2022-07-25 ENCOUNTER — Other Ambulatory Visit (HOSPITAL_COMMUNITY): Payer: Self-pay

## 2022-07-26 ENCOUNTER — Ambulatory Visit (INDEPENDENT_AMBULATORY_CARE_PROVIDER_SITE_OTHER): Payer: Self-pay

## 2022-07-26 DIAGNOSIS — I8393 Asymptomatic varicose veins of bilateral lower extremities: Secondary | ICD-10-CM

## 2022-07-26 NOTE — Progress Notes (Signed)
Treated pt's small reticular and spider veins of  RLE with Asclera 1% administered with a 27g butterfly.  Patient received a total of 20 mg/24m. Easy access. Pt tolerated well. Pt was given post treatment care instructions on handout and verbally. Pt wanted to schedule another appt at the end of March and will call to cancel if she does not feel it is needed.   Photos: Yes.    Compression stockings applied: Yes.

## 2022-08-12 ENCOUNTER — Telehealth: Payer: Self-pay

## 2022-08-12 NOTE — Telephone Encounter (Signed)
Pt called to f/u after sclerotherapy. She is 2 weeks s/p treatment and did not have any concerns. Is seeing improvement and was very pleased with minimal bruising. She is back to exercising more vigorously this week and has another treatment scheduled in March. She will call if any questions/concerns arise.

## 2022-08-23 ENCOUNTER — Ambulatory Visit
Admission: RE | Admit: 2022-08-23 | Discharge: 2022-08-23 | Disposition: A | Discharge status: Home / Self Care | Payer: Commercial Managed Care - PPO | Source: Ambulatory Visit | Attending: Internal Medicine | Admitting: Internal Medicine

## 2022-08-23 DIAGNOSIS — Z1231 Encounter for screening mammogram for malignant neoplasm of breast: Secondary | ICD-10-CM

## 2022-09-04 ENCOUNTER — Other Ambulatory Visit (HOSPITAL_COMMUNITY): Payer: Self-pay

## 2022-09-04 MED ORDER — LATANOPROST 0.005 % OP SOLN
1.0000 [drp] | Freq: Every evening | OPHTHALMIC | 3 refills | Status: AC
  Filled 2022-09-04 – 2022-09-13 (×2): qty 7.5, 75d supply, fill #0

## 2022-09-09 ENCOUNTER — Other Ambulatory Visit (HOSPITAL_COMMUNITY): Payer: Self-pay

## 2022-09-13 ENCOUNTER — Other Ambulatory Visit (HOSPITAL_COMMUNITY): Payer: Self-pay

## 2022-09-16 ENCOUNTER — Other Ambulatory Visit (HOSPITAL_COMMUNITY): Payer: Self-pay

## 2022-09-17 DIAGNOSIS — Z01419 Encounter for gynecological examination (general) (routine) without abnormal findings: Secondary | ICD-10-CM | POA: Diagnosis not present

## 2022-09-17 DIAGNOSIS — Z6823 Body mass index (BMI) 23.0-23.9, adult: Secondary | ICD-10-CM | POA: Diagnosis not present

## 2022-10-01 ENCOUNTER — Other Ambulatory Visit (HOSPITAL_COMMUNITY): Payer: Self-pay

## 2022-10-01 MED ORDER — HYDROCHLOROTHIAZIDE 25 MG PO TABS
25.0000 mg | ORAL_TABLET | Freq: Every morning | ORAL | 4 refills | Status: DC
  Filled 2022-10-01: qty 90, 90d supply, fill #0
  Filled 2022-10-03 (×2): qty 30, 30d supply, fill #0
  Filled 2022-10-15: qty 60, 60d supply, fill #1
  Filled 2022-12-19: qty 60, 60d supply, fill #2
  Filled 2023-02-12: qty 60, 60d supply, fill #3
  Filled 2023-03-27: qty 60, 60d supply, fill #4
  Filled 2023-03-27: qty 90, 90d supply, fill #4
  Filled 2023-06-06: qty 90, 90d supply, fill #5
  Filled 2023-08-26: qty 60, 60d supply, fill #6

## 2022-10-02 DIAGNOSIS — H401131 Primary open-angle glaucoma, bilateral, mild stage: Secondary | ICD-10-CM | POA: Diagnosis not present

## 2022-10-03 ENCOUNTER — Other Ambulatory Visit: Payer: Self-pay

## 2022-10-03 ENCOUNTER — Other Ambulatory Visit (HOSPITAL_COMMUNITY): Payer: Self-pay

## 2022-10-03 DIAGNOSIS — Z79899 Other long term (current) drug therapy: Secondary | ICD-10-CM | POA: Diagnosis not present

## 2022-10-15 ENCOUNTER — Other Ambulatory Visit (HOSPITAL_COMMUNITY): Payer: Self-pay

## 2022-10-15 ENCOUNTER — Ambulatory Visit: Payer: Commercial Managed Care - PPO

## 2022-10-23 DIAGNOSIS — M13842 Other specified arthritis, left hand: Secondary | ICD-10-CM | POA: Diagnosis not present

## 2022-10-23 DIAGNOSIS — M1811 Unilateral primary osteoarthritis of first carpometacarpal joint, right hand: Secondary | ICD-10-CM | POA: Diagnosis not present

## 2022-11-04 DIAGNOSIS — D2261 Melanocytic nevi of right upper limb, including shoulder: Secondary | ICD-10-CM | POA: Diagnosis not present

## 2022-11-04 DIAGNOSIS — D2272 Melanocytic nevi of left lower limb, including hip: Secondary | ICD-10-CM | POA: Diagnosis not present

## 2022-11-04 DIAGNOSIS — L821 Other seborrheic keratosis: Secondary | ICD-10-CM | POA: Diagnosis not present

## 2022-11-04 DIAGNOSIS — D225 Melanocytic nevi of trunk: Secondary | ICD-10-CM | POA: Diagnosis not present

## 2022-11-04 DIAGNOSIS — B351 Tinea unguium: Secondary | ICD-10-CM | POA: Diagnosis not present

## 2022-11-04 DIAGNOSIS — D2271 Melanocytic nevi of right lower limb, including hip: Secondary | ICD-10-CM | POA: Diagnosis not present

## 2022-11-04 DIAGNOSIS — D2262 Melanocytic nevi of left upper limb, including shoulder: Secondary | ICD-10-CM | POA: Diagnosis not present

## 2022-12-06 ENCOUNTER — Ambulatory Visit: Payer: Commercial Managed Care - PPO

## 2022-12-19 ENCOUNTER — Other Ambulatory Visit (HOSPITAL_COMMUNITY): Payer: Self-pay

## 2023-03-07 DIAGNOSIS — M8580 Other specified disorders of bone density and structure, unspecified site: Secondary | ICD-10-CM | POA: Diagnosis not present

## 2023-03-07 DIAGNOSIS — E785 Hyperlipidemia, unspecified: Secondary | ICD-10-CM | POA: Diagnosis not present

## 2023-03-07 DIAGNOSIS — R7301 Impaired fasting glucose: Secondary | ICD-10-CM | POA: Diagnosis not present

## 2023-03-07 DIAGNOSIS — R7989 Other specified abnormal findings of blood chemistry: Secondary | ICD-10-CM | POA: Diagnosis not present

## 2023-03-14 DIAGNOSIS — M19031 Primary osteoarthritis, right wrist: Secondary | ICD-10-CM | POA: Diagnosis not present

## 2023-03-14 DIAGNOSIS — Z0000 Encounter for general adult medical examination without abnormal findings: Secondary | ICD-10-CM | POA: Diagnosis not present

## 2023-03-14 DIAGNOSIS — N951 Menopausal and female climacteric states: Secondary | ICD-10-CM | POA: Diagnosis not present

## 2023-03-14 DIAGNOSIS — Z1339 Encounter for screening examination for other mental health and behavioral disorders: Secondary | ICD-10-CM | POA: Diagnosis not present

## 2023-03-14 DIAGNOSIS — R82998 Other abnormal findings in urine: Secondary | ICD-10-CM | POA: Diagnosis not present

## 2023-03-14 DIAGNOSIS — H409 Unspecified glaucoma: Secondary | ICD-10-CM | POA: Diagnosis not present

## 2023-03-14 DIAGNOSIS — M8580 Other specified disorders of bone density and structure, unspecified site: Secondary | ICD-10-CM | POA: Diagnosis not present

## 2023-03-14 DIAGNOSIS — R7301 Impaired fasting glucose: Secondary | ICD-10-CM | POA: Diagnosis not present

## 2023-03-14 DIAGNOSIS — Z1331 Encounter for screening for depression: Secondary | ICD-10-CM | POA: Diagnosis not present

## 2023-03-14 DIAGNOSIS — E785 Hyperlipidemia, unspecified: Secondary | ICD-10-CM | POA: Diagnosis not present

## 2023-03-27 ENCOUNTER — Other Ambulatory Visit (HOSPITAL_COMMUNITY): Payer: Self-pay

## 2023-04-26 DIAGNOSIS — Z23 Encounter for immunization: Secondary | ICD-10-CM | POA: Diagnosis not present

## 2023-05-26 ENCOUNTER — Other Ambulatory Visit: Payer: Self-pay | Admitting: *Deleted

## 2023-05-26 DIAGNOSIS — I8393 Asymptomatic varicose veins of bilateral lower extremities: Secondary | ICD-10-CM

## 2023-06-03 ENCOUNTER — Encounter: Payer: Self-pay | Admitting: Physician Assistant

## 2023-06-03 ENCOUNTER — Ambulatory Visit: Payer: Commercial Managed Care - PPO | Admitting: Physician Assistant

## 2023-06-03 ENCOUNTER — Ambulatory Visit (HOSPITAL_COMMUNITY)
Admission: RE | Admit: 2023-06-03 | Discharge: 2023-06-03 | Disposition: A | Discharge status: Home / Self Care | Payer: Commercial Managed Care - PPO | Source: Ambulatory Visit | Attending: Vascular Surgery | Admitting: Vascular Surgery

## 2023-06-03 VITALS — BP 138/85 | HR 52 | Temp 97.9°F | Ht 64.0 in | Wt 135.3 lb

## 2023-06-03 DIAGNOSIS — I8393 Asymptomatic varicose veins of bilateral lower extremities: Secondary | ICD-10-CM

## 2023-06-03 NOTE — Progress Notes (Signed)
[facility] [place]   Reason for referral:   History of Present Illness  Jody Duncan is a 62 y.o. female who presents for vein follow up.  She has a history of cellulitis that is managed with NSAIDs and cold compresses.  She denies any history of DVT, venous ulcerations, trauma, or prior vascular intervention. She is very active and does some form of cardio every day.   In the past she has had She has compression stockings however does not wear them.  She does not make an effort to elevate her legs during the day as she does not experience much swelling.   She denies alcohol use or tobacco use.  She is interested in doing sclero therapy again and wants to follow up after Christmas.            Past Medical History:  Diagnosis Date   Allergy    dust, animal hair    Past Surgical History:  Procedure Laterality Date   APPENDECTOMY     WISDOM TOOTH EXTRACTION      Social History   Socioeconomic History   Marital status: Married    Spouse name: Not on file   Number of children: Not on file   Years of education: Not on file   Highest education level: Not on file  Occupational History   Not on file  Tobacco Use   Smoking status: Never    Passive exposure: Never   Smokeless tobacco: Never  Vaping Use   Vaping status: Never Used  Substance and Sexual Activity   Alcohol use: Yes    Comment: occasional   Drug use: Never   Sexual activity: Not on file  Other Topics Concern   Not on file  Social History Narrative   Not on file   Social Determinants of Health   Financial Resource Strain: Not on file  Food Insecurity: Not on file  Transportation Needs: Not on file  Physical Activity: Not on file  Stress: Not on file  Social Connections: Not on file  Intimate Partner Violence: Not on file    Family History  Problem Relation Age of Onset   Breast cancer Neg Hx    Colon cancer Neg Hx    Esophageal cancer Neg Hx    Stomach cancer Neg Hx     Rectal cancer Neg Hx     Current Outpatient Medications on File Prior to Visit  Medication Sig Dispense Refill   BIOTIN PO Take by mouth daily.     Cholecalciferol (VITAMIN D3 PO) daily.     hydrochlorothiazide (HYDRODIURIL) 25 MG tablet Take 25 mg by mouth daily as needed.  6   hydrochlorothiazide (HYDRODIURIL) 25 MG tablet TAKE 1 TABLET BY MOUTH ONCE DAILY AS NEEDED 90 tablet 2   hydrochlorothiazide (HYDRODIURIL) 25 MG tablet Take 1 tablet (25 mg total) by mouth in the morning as needed. 90 tablet 4   ibuprofen (ADVIL) 800 MG tablet TAKE 1 TABLET BY MOUTH 3 TIMES DAILY AS NEEDED 90 tablet PRN   latanoprost (XALATAN) 0.005 % ophthalmic solution Place 1 drop into both eyes at bedtime. 7.5 mL 3   MAGNESIUM PO Take by mouth daily.     Omega-3 Fatty Acids (OMEGA 3 PO) Take by mouth daily.     Progesterone Micronized (PROGESTERONE COMPOUNDING KIT TD) Place onto the skin. Applies to skin twice daily     Pyridoxine HCl (VITAMIN B-6 PO) Take by mouth daily.     terbinafine (  LAMISIL) 250 MG tablet Take 1 tablet (250 mg total) by mouth once every other month 7 tablet 0   terbinafine (LAMISIL) 250 MG tablet Take 1 tablet (250 mg total) by mouth daily for 7 days every other month. 30 tablet 0   terbinafine (LAMISIL) 250 MG tablet Take 1 tablet (250 mg total) by mouth daily. 30 tablet 2   triamcinolone cream (KENALOG) 0.1 % Apply 1 Application topically 2 (two) times daily to areas fo rash for 1-2 weeks until completely smooth. 80 g 0   TURMERIC PO Take by mouth daily.     VITAMIN E PO Take by mouth daily.     Current Facility-Administered Medications on File Prior to Visit  Medication Dose Route Frequency Provider Last Rate Last Admin   0.9 %  sodium chloride infusion  500 mL Intravenous Once Pyrtle, Carie Caddy, MD        Allergies as of 06/03/2023 - Review Complete 07/26/2022  Allergen Reaction Noted   Dust mite extract Other (See Comments) 03/27/2022   Mite (d. farinae)  11/30/2020     ROS:    General:  No weight loss, Fever, chills  HEENT: No recent headaches, no nasal bleeding, no visual changes, no sore throat  Neurologic: No dizziness, blackouts, seizures. No recent symptoms of stroke or mini- stroke. No recent episodes of slurred speech, or temporary blindness.  Cardiac: No recent episodes of chest pain/pressure, no shortness of breath at rest.  No shortness of breath with exertion.  Denies history of atrial fibrillation or irregular heartbeat  Vascular: No history of rest pain in feet.  No history of claudication.  No history of non-healing ulcer, No history of DVT   Pulmonary: No home oxygen, no productive cough, no hemoptysis,  No asthma or wheezing  Musculoskeletal:  [x ] Arthritis, [ ]  Low back pain,  [ ]  Joint pain  Hematologic:No history of hypercoagulable state.  No history of easy bleeding.  No history of anemia  Gastrointestinal: No hematochezia or melena,  No gastroesophageal reflux, no trouble swallowing  Urinary: [ ]  chronic Kidney disease, [ ]  on HD - [ ]  MWF or [ ]  TTHS, [ ]  Burning with urination, [ ]  Frequent urination, [ ]  Difficulty urinating;   Skin: No rashes  Psychological: No history of anxiety,  No history of depression  Physical Examination  Vitals:   06/03/23 1323  BP: 138/85  Pulse: (!) 52  Temp: 97.9 F (36.6 C)  SpO2: 96%  Weight: 135 lb 4.8 oz (61.4 kg)  Height: 5\' 4"  (1.626 m)    Body mass index is 23.22 kg/m.  General:  Alert and oriented, no acute distress HEENT: Normal Neck: No bruit or JVD Pulmonary: Clear to auscultation bilaterally Cardiac: Regular Rate and Rhythm without murmur Abdomen: Soft, non-tender, non-distended, no mass, no scars Skin: No rash Extremity Pulses:  2+ radial,  femoral, dorsalis pedis,  pulses bilaterally Musculoskeletal: No deformity or edema  Neurologic: Upper and lower extremity motor 5/5 and symmetric  DATA: Venous Reflux Times   +--------------+---------+------+-----------+------------+--------+  RIGHT        Reflux NoRefluxReflux TimeDiameter cmsComments                          Yes                                   +--------------+---------+------+-----------+------------+--------+  CFV  no                                              +--------------+---------+------+-----------+------------+--------+  FV mid        no                                              +--------------+---------+------+-----------+------------+--------+  Popliteal    no                                              +--------------+---------+------+-----------+------------+--------+  GSV at SFJ              yes    >500 ms      .445              +--------------+---------+------+-----------+------------+--------+  GSV prox thighno                            .329              +--------------+---------+------+-----------+------------+--------+  GSV mid thigh           yes    >500 ms      .340              +--------------+---------+------+-----------+------------+--------+  GSV dist thigh          yes    >500 ms      .333              +--------------+---------+------+-----------+------------+--------+  GSV at knee             yes    >500 ms      .319              +--------------+---------+------+-----------+------------+--------+  GSV prox calf no                            .390              +--------------+---------+------+-----------+------------+--------+  SSV Pop Fossa no                            .240              +--------------+---------+------+-----------+------------+--------+  SSV prox calf no                            .146              +--------------+---------+------+-----------+------------+--------+     Summary:  Right:  - No evidence of deep vein thrombosis seen in the right lower extremity,  from the common femoral  through the popliteal veins.  - No evidence of superficial venous thrombosis in the right lower  extremity.  - Venous reflux is noted in the right sapheno-femoral junction.  - Venous reflux is noted in the right greater saphenous vein in the thigh.      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  Other wise her reflux duplex is unchanged with no deep reflux and SFJ reflux with small vein diameter < 4 mm, thus she would not be a candidate for laser ablation therapy.   Continue to stay active, elevate if necessary  for edema should it occur.   She has palpable pedal pulses and is not at risk of limb loss.   F/U PRN   Mosetta Pigeon PA-C [facility] [place] Office: [phone]  MD in clinic [place]

## 2023-06-06 ENCOUNTER — Other Ambulatory Visit (HOSPITAL_COMMUNITY): Payer: Self-pay

## 2023-06-11 DIAGNOSIS — M13841 Other specified arthritis, right hand: Secondary | ICD-10-CM | POA: Diagnosis not present

## 2023-06-11 DIAGNOSIS — M13842 Other specified arthritis, left hand: Secondary | ICD-10-CM | POA: Diagnosis not present

## 2023-06-27 IMAGING — MG MM DIGITAL SCREENING BILAT W/ TOMO AND CAD
8 series · 8 of 24 positions shown · non-contrast
Comparison: Previous exam(s).

CLINICAL DATA: Screening.

EXAM:
DIGITAL SCREENING BILATERAL MAMMOGRAM WITH TOMOSYNTHESIS AND CAD
TECHNIQUE: Bilateral screening digital craniocaudal and mediolateral oblique
mammograms were obtained. Bilateral screening digital breast
tomosynthesis was performed. The images were evaluated with
computer-aided detection.

[R MLO synth-2D]
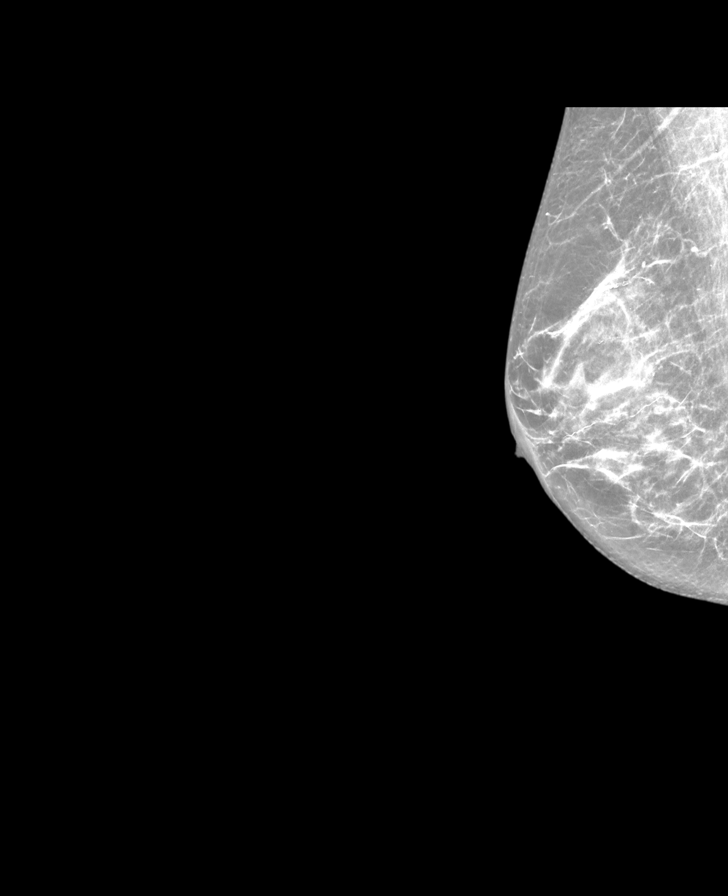

[R CC synth-2D]
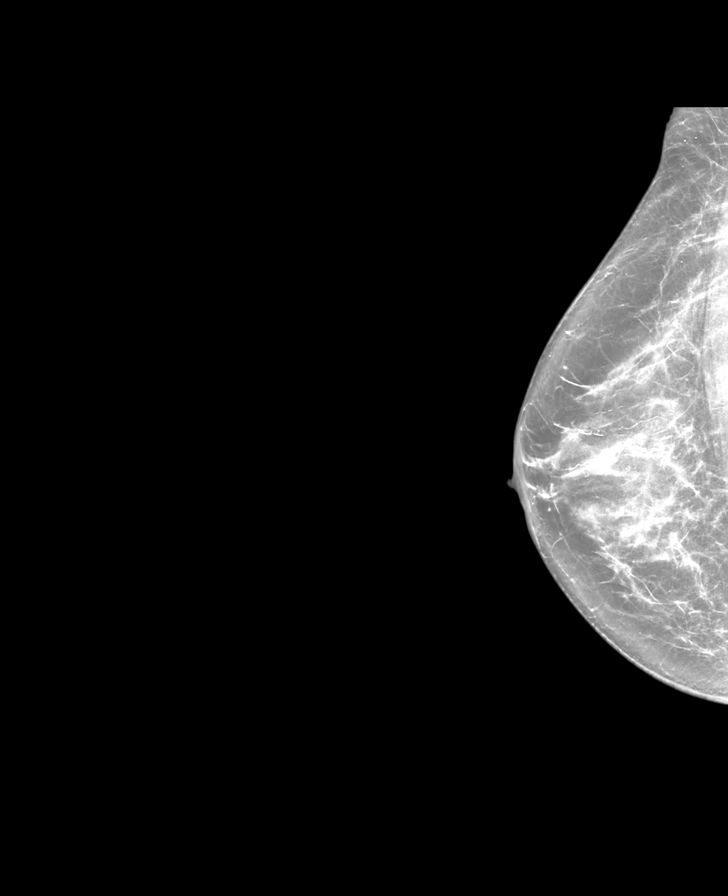

[L MLO synth-2D]
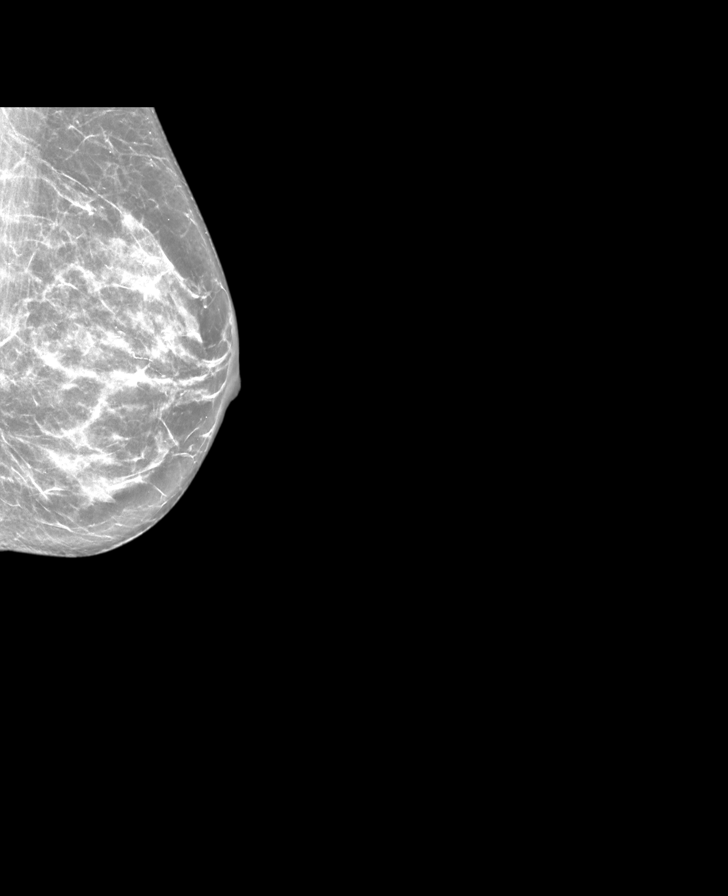

[L CC synth-2D]
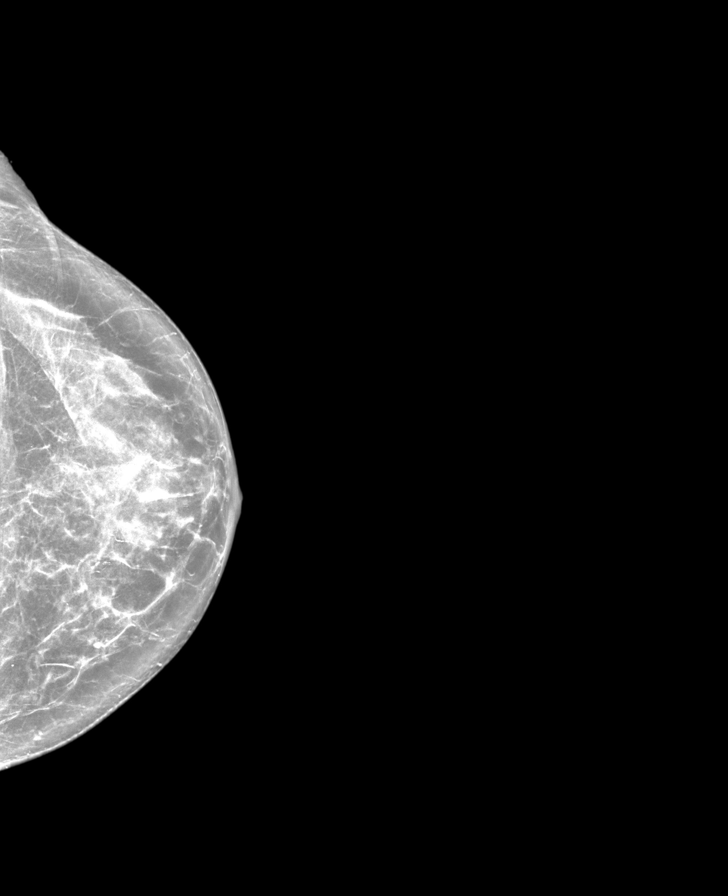

[R CC tomo · tomo slice 35/70.0]
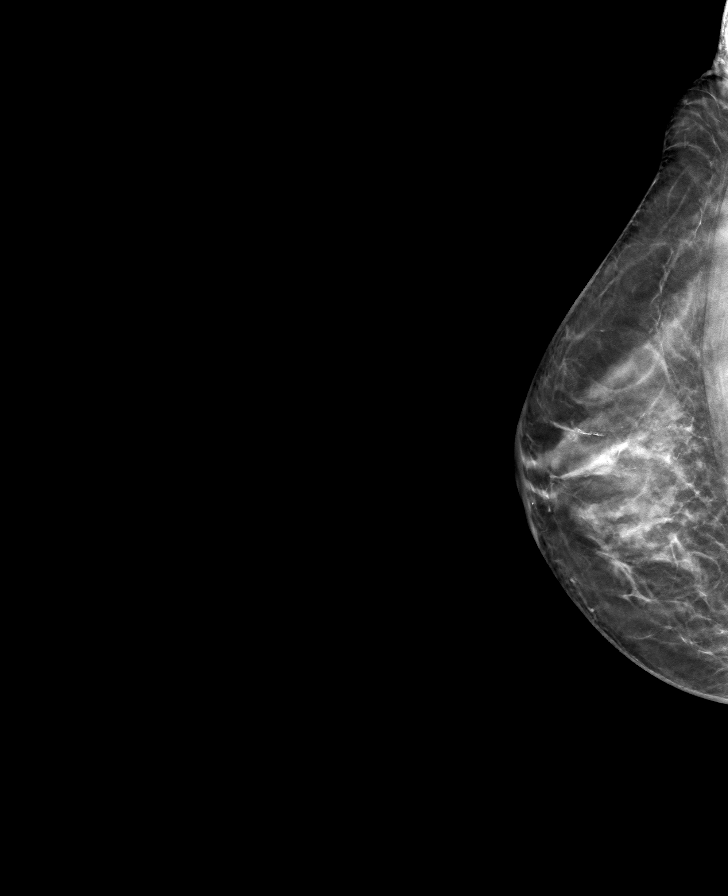

[L CC tomo · tomo slice 33/66.0]
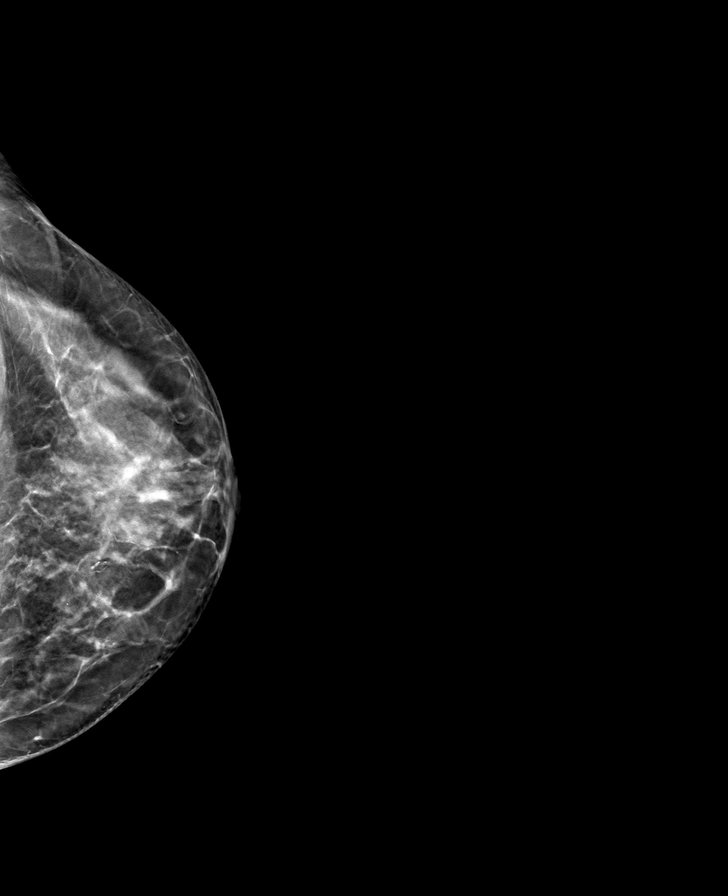

[L MLO tomo · tomo slice 29/58.0]
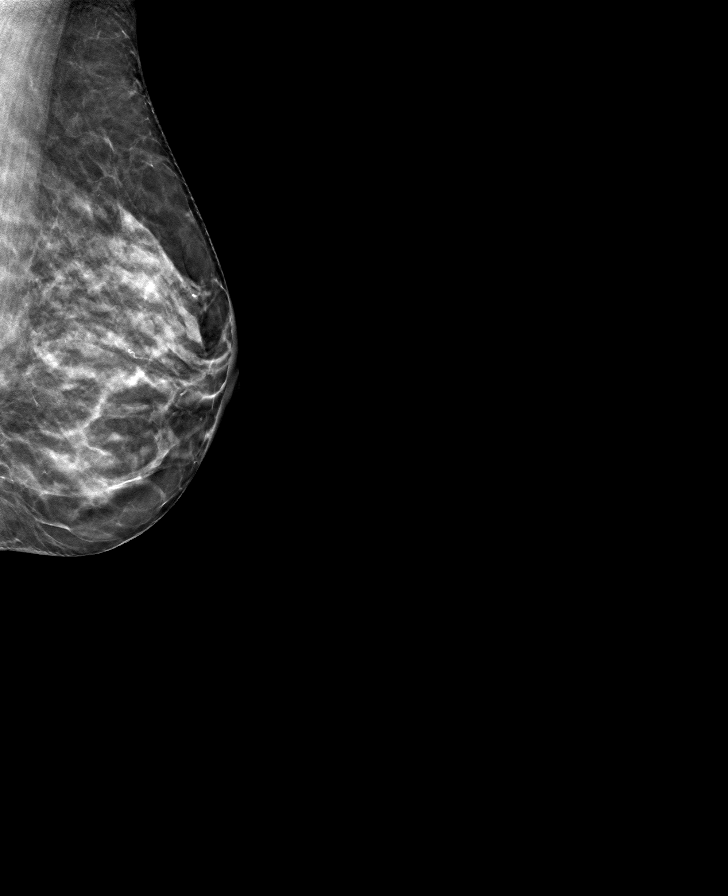

[R MLO tomo · tomo slice 31/62.0]
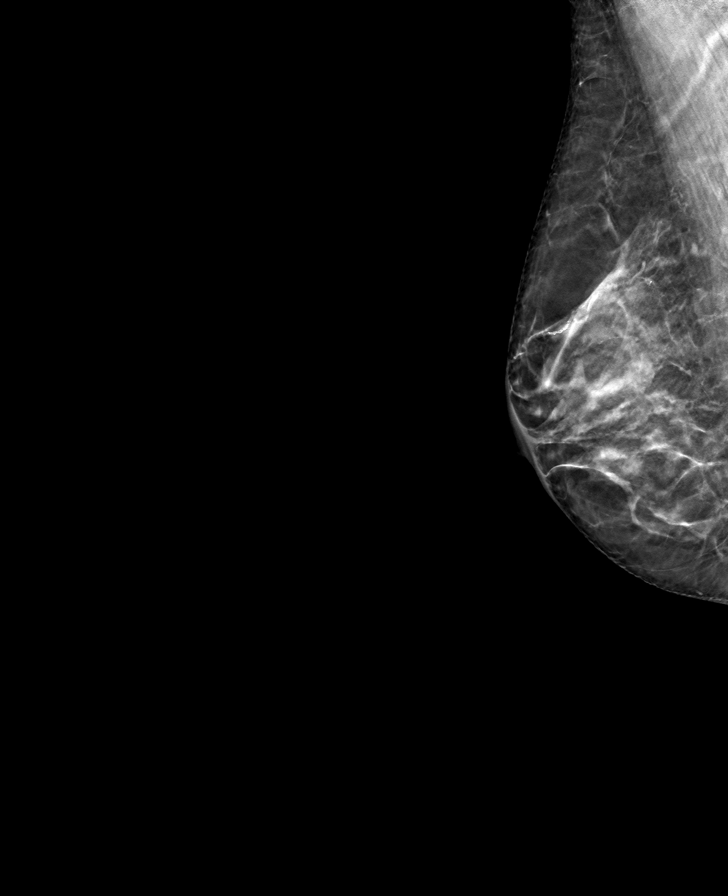

[8 of 24 positions shown; findings below may reference images not displayed]

ACR Breast Density Category c: The breast tissue is heterogeneously
dense, which may obscure small masses.
FINDINGS: There are no findings suspicious for malignancy.
IMPRESSION: No mammographic evidence of malignancy. A result letter of this
screening mammogram will be mailed directly to the patient.

RECOMMENDATION:
Screening mammogram in one year. (Code:Q3-W-BC3)

BI-RADS CATEGORY  1: Negative.

## 2023-06-30 ENCOUNTER — Other Ambulatory Visit (HOSPITAL_COMMUNITY): Payer: Self-pay

## 2023-06-30 DIAGNOSIS — H401134 Primary open-angle glaucoma, bilateral, indeterminate stage: Secondary | ICD-10-CM | POA: Diagnosis not present

## 2023-06-30 MED ORDER — LATANOPROST 0.005 % OP SOLN
1.0000 [drp] | Freq: Every evening | OPHTHALMIC | 11 refills | Status: AC
  Filled 2023-06-30: qty 7.5, 75d supply, fill #0
  Filled 2023-12-23: qty 7.5, 75d supply, fill #1
  Filled 2024-06-09: qty 7.5, 75d supply, fill #2

## 2023-07-04 ENCOUNTER — Other Ambulatory Visit (HOSPITAL_COMMUNITY): Payer: Self-pay

## 2023-07-21 ENCOUNTER — Ambulatory Visit: Payer: Commercial Managed Care - PPO

## 2023-07-24 ENCOUNTER — Encounter (HOSPITAL_COMMUNITY): Payer: Commercial Managed Care - PPO

## 2023-07-24 ENCOUNTER — Ambulatory Visit: Payer: Commercial Managed Care - PPO

## 2023-07-28 ENCOUNTER — Other Ambulatory Visit: Payer: Self-pay | Admitting: Internal Medicine

## 2023-07-28 DIAGNOSIS — Z1231 Encounter for screening mammogram for malignant neoplasm of breast: Secondary | ICD-10-CM

## 2023-08-01 ENCOUNTER — Ambulatory Visit (INDEPENDENT_AMBULATORY_CARE_PROVIDER_SITE_OTHER): Payer: Self-pay

## 2023-08-01 DIAGNOSIS — I8393 Asymptomatic varicose veins of bilateral lower extremities: Secondary | ICD-10-CM

## 2023-08-01 NOTE — Progress Notes (Signed)
 Treated pt's spider veins on BLE with Asclera 1% administered with a 27 gauge butterfly needle. Pt received a total of 2 mL/20 mg of Asclera 1%. Pt tolerated very well. She was put in 20-30 mm Hg thigh high compression hose at the end of treatment. Post treatment care instructions given on handout and verbally. She will call us  if she has any questions/concerns.

## 2023-08-26 ENCOUNTER — Ambulatory Visit
Admission: RE | Admit: 2023-08-26 | Discharge: 2023-08-26 | Disposition: A | Discharge status: Home / Self Care | Payer: Commercial Managed Care - PPO | Source: Ambulatory Visit | Attending: Internal Medicine | Admitting: Internal Medicine

## 2023-08-26 ENCOUNTER — Other Ambulatory Visit (HOSPITAL_COMMUNITY): Payer: Self-pay

## 2023-08-26 DIAGNOSIS — Z1231 Encounter for screening mammogram for malignant neoplasm of breast: Secondary | ICD-10-CM | POA: Diagnosis not present

## 2023-10-16 ENCOUNTER — Other Ambulatory Visit (HOSPITAL_COMMUNITY): Payer: Self-pay

## 2023-10-17 ENCOUNTER — Other Ambulatory Visit (HOSPITAL_COMMUNITY): Payer: Self-pay

## 2023-10-17 MED ORDER — HYDROCHLOROTHIAZIDE 25 MG PO TABS
25.0000 mg | ORAL_TABLET | Freq: Every day | ORAL | 4 refills | Status: DC | PRN
  Filled 2023-10-17: qty 90, 90d supply, fill #0
  Filled 2023-12-23: qty 90, 90d supply, fill #1
  Filled 2024-02-19: qty 90, 90d supply, fill #2
  Filled 2024-03-23: qty 90, 90d supply, fill #3
  Filled 2024-05-12: qty 90, 90d supply, fill #4

## 2023-12-08 DIAGNOSIS — H401134 Primary open-angle glaucoma, bilateral, indeterminate stage: Secondary | ICD-10-CM | POA: Diagnosis not present

## 2023-12-08 DIAGNOSIS — H40033 Anatomical narrow angle, bilateral: Secondary | ICD-10-CM | POA: Diagnosis not present

## 2023-12-16 DIAGNOSIS — D2271 Melanocytic nevi of right lower limb, including hip: Secondary | ICD-10-CM | POA: Diagnosis not present

## 2023-12-16 DIAGNOSIS — D2272 Melanocytic nevi of left lower limb, including hip: Secondary | ICD-10-CM | POA: Diagnosis not present

## 2023-12-16 DIAGNOSIS — D2261 Melanocytic nevi of right upper limb, including shoulder: Secondary | ICD-10-CM | POA: Diagnosis not present

## 2023-12-16 DIAGNOSIS — D485 Neoplasm of uncertain behavior of skin: Secondary | ICD-10-CM | POA: Diagnosis not present

## 2023-12-16 DIAGNOSIS — L821 Other seborrheic keratosis: Secondary | ICD-10-CM | POA: Diagnosis not present

## 2023-12-16 DIAGNOSIS — D225 Melanocytic nevi of trunk: Secondary | ICD-10-CM | POA: Diagnosis not present

## 2023-12-16 DIAGNOSIS — L814 Other melanin hyperpigmentation: Secondary | ICD-10-CM | POA: Diagnosis not present

## 2023-12-23 ENCOUNTER — Other Ambulatory Visit (HOSPITAL_COMMUNITY): Payer: Self-pay

## 2023-12-30 DIAGNOSIS — H2513 Age-related nuclear cataract, bilateral: Secondary | ICD-10-CM | POA: Diagnosis not present

## 2023-12-30 DIAGNOSIS — H402211 Chronic angle-closure glaucoma, right eye, mild stage: Secondary | ICD-10-CM | POA: Diagnosis not present

## 2023-12-30 DIAGNOSIS — H40033 Anatomical narrow angle, bilateral: Secondary | ICD-10-CM | POA: Diagnosis not present

## 2024-01-05 ENCOUNTER — Telehealth: Payer: Self-pay

## 2024-01-05 NOTE — Telephone Encounter (Signed)
 Pt called with c/o RLE behind knee area feeling warmth and bulging of spider veins. Looking back at her pics from January before sclerotherapy, she described the same spots that are visible on the pictures taken. She felt these initially went away after sclero and recently returned. She denies any pain/swelling/redness. She is going to have hand surgery and eye surgery this summer but will look into treating the area again with sclero in the colder months. She is aware to call her PCP if she develops pain/redness/swelling.

## 2024-01-19 DIAGNOSIS — D0439 Carcinoma in situ of skin of other parts of face: Secondary | ICD-10-CM | POA: Diagnosis not present

## 2024-01-19 DIAGNOSIS — D044 Carcinoma in situ of skin of scalp and neck: Secondary | ICD-10-CM | POA: Diagnosis not present

## 2024-01-19 DIAGNOSIS — D485 Neoplasm of uncertain behavior of skin: Secondary | ICD-10-CM | POA: Diagnosis not present

## 2024-01-20 ENCOUNTER — Other Ambulatory Visit (HOSPITAL_COMMUNITY): Payer: Self-pay

## 2024-01-20 DIAGNOSIS — H40033 Anatomical narrow angle, bilateral: Secondary | ICD-10-CM | POA: Diagnosis not present

## 2024-01-20 DIAGNOSIS — H2513 Age-related nuclear cataract, bilateral: Secondary | ICD-10-CM | POA: Diagnosis not present

## 2024-01-20 DIAGNOSIS — H402211 Chronic angle-closure glaucoma, right eye, mild stage: Secondary | ICD-10-CM | POA: Diagnosis not present

## 2024-01-20 MED ORDER — PREDNISOLONE ACETATE 1 % OP SUSP
1.0000 [drp] | Freq: Four times a day (QID) | OPHTHALMIC | 0 refills | Status: AC
  Filled 2024-01-20: qty 5, 25d supply, fill #0

## 2024-01-27 ENCOUNTER — Other Ambulatory Visit: Payer: Self-pay

## 2024-01-27 ENCOUNTER — Encounter (HOSPITAL_BASED_OUTPATIENT_CLINIC_OR_DEPARTMENT_OTHER): Payer: Self-pay | Admitting: Orthopedic Surgery

## 2024-02-02 DIAGNOSIS — Z124 Encounter for screening for malignant neoplasm of cervix: Secondary | ICD-10-CM | POA: Diagnosis not present

## 2024-02-02 DIAGNOSIS — Z01419 Encounter for gynecological examination (general) (routine) without abnormal findings: Secondary | ICD-10-CM | POA: Diagnosis not present

## 2024-02-02 DIAGNOSIS — Z6823 Body mass index (BMI) 23.0-23.9, adult: Secondary | ICD-10-CM | POA: Diagnosis not present

## 2024-02-02 NOTE — Anesthesia Preprocedure Evaluation (Addendum)
 Anesthesia Evaluation  Patient identified by MRN, date of birth, ID band Patient awake    Reviewed: Allergy & Precautions, NPO status , Patient's Chart, lab work & pertinent test results  Airway Mallampati: II  TM Distance: >3 FB Neck ROM: Full    Dental no notable dental hx. 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   Reproductive/Obstetrics                              Anesthesia Physical Anesthesia Plan  ASA: 2  Anesthesia Plan: Regional and MAC   Post-op Pain Management: Tylenol  PO (pre-op)*, Precedex, Regional block* and Minimal or no pain anticipated   Induction:   PONV Risk Score and Plan: 2 and Propofol  infusion, Treatment may vary due to age or medical condition and Midazolam   Airway Management Planned: Natural Airway and Simple Face Mask  Additional Equipment: None  Intra-op Plan:   Post-operative Plan:   Informed Consent: I have reviewed the patients History and Physical, chart, labs and discussed the procedure including the risks, benefits and alternatives for the proposed anesthesia with the patient or authorized representative who has indicated his/her understanding and acceptance.     Dental advisory given  Plan Discussed with: CRNA and Surgeon  Anesthesia Plan Comments: (R Supraclavicular block w mac)         Anesthesia Quick  Evaluation

## 2024-02-03 ENCOUNTER — Encounter (HOSPITAL_BASED_OUTPATIENT_CLINIC_OR_DEPARTMENT_OTHER)
Admission: RE | Disposition: A | Discharge status: Home / Self Care | Payer: Self-pay | Source: Home / Self Care | Attending: Orthopedic Surgery

## 2024-02-03 ENCOUNTER — Encounter (HOSPITAL_BASED_OUTPATIENT_CLINIC_OR_DEPARTMENT_OTHER): Payer: Self-pay | Admitting: Orthopedic Surgery

## 2024-02-03 ENCOUNTER — Other Ambulatory Visit: Payer: Self-pay

## 2024-02-03 ENCOUNTER — Ambulatory Visit (HOSPITAL_BASED_OUTPATIENT_CLINIC_OR_DEPARTMENT_OTHER)

## 2024-02-03 ENCOUNTER — Ambulatory Visit (HOSPITAL_BASED_OUTPATIENT_CLINIC_OR_DEPARTMENT_OTHER)
Admission: RE | Admit: 2024-02-03 | Discharge: 2024-02-03 | Disposition: A | Discharge status: Home / Self Care | Attending: Orthopedic Surgery | Admitting: Orthopedic Surgery

## 2024-02-03 ENCOUNTER — Ambulatory Visit (HOSPITAL_BASED_OUTPATIENT_CLINIC_OR_DEPARTMENT_OTHER): Payer: Self-pay | Admitting: Anesthesiology

## 2024-02-03 ENCOUNTER — Other Ambulatory Visit (HOSPITAL_COMMUNITY): Payer: Self-pay

## 2024-02-03 DIAGNOSIS — Z01818 Encounter for other preprocedural examination: Secondary | ICD-10-CM

## 2024-02-03 DIAGNOSIS — G5601 Carpal tunnel syndrome, right upper limb: Secondary | ICD-10-CM | POA: Diagnosis not present

## 2024-02-03 DIAGNOSIS — M13841 Other specified arthritis, right hand: Secondary | ICD-10-CM | POA: Diagnosis not present

## 2024-02-03 DIAGNOSIS — M1811 Unilateral primary osteoarthritis of first carpometacarpal joint, right hand: Secondary | ICD-10-CM

## 2024-02-03 DIAGNOSIS — M24531 Contracture, right wrist: Secondary | ICD-10-CM | POA: Diagnosis not present

## 2024-02-03 HISTORY — PX: CARPAL TUNNEL RELEASE: SHX101

## 2024-02-03 HISTORY — PX: CARPOMETACARPAL (CMC) FUSION OF THUMB: SHX6290

## 2024-02-03 HISTORY — PX: TENDON TRANSFER: SHX6109

## 2024-02-03 HISTORY — DX: Unspecified glaucoma: H40.9

## 2024-02-03 SURGERY — CARPOMETACARPAL (CMC) FUSION OF THUMB
Anesthesia: Monitor Anesthesia Care | Site: Wrist | Laterality: Right

## 2024-02-03 MED ORDER — METHOCARBAMOL 500 MG PO TABS
500.0000 mg | ORAL_TABLET | Freq: Four times a day (QID) | ORAL | 0 refills | Status: AC | PRN
  Filled 2024-02-03: qty 40, 10d supply, fill #0

## 2024-02-03 MED ORDER — PROPOFOL 500 MG/50ML IV EMUL
INTRAVENOUS | Status: DC | PRN
  Administered 2024-02-03: 125 ug/kg/min via INTRAVENOUS

## 2024-02-03 MED ORDER — ACETAMINOPHEN 500 MG PO TABS
ORAL_TABLET | ORAL | Status: AC
  Filled 2024-02-03: qty 2

## 2024-02-03 MED ORDER — FENTANYL CITRATE (PF) 100 MCG/2ML IJ SOLN
INTRAMUSCULAR | Status: AC
  Filled 2024-02-03: qty 2

## 2024-02-03 MED ORDER — 0.9 % SODIUM CHLORIDE (POUR BTL) OPTIME
TOPICAL | Status: DC | PRN
  Administered 2024-02-03: 1000 mL

## 2024-02-03 MED ORDER — BUPIVACAINE HCL (PF) 0.25 % IJ SOLN
INTRAMUSCULAR | Status: AC
  Filled 2024-02-03: qty 30

## 2024-02-03 MED ORDER — ACETAMINOPHEN 500 MG PO TABS
1000.0000 mg | ORAL_TABLET | Freq: Once | ORAL | Status: AC
  Administered 2024-02-03: 1000 mg via ORAL

## 2024-02-03 MED ORDER — MIDAZOLAM HCL 2 MG/2ML IJ SOLN
2.0000 mg | Freq: Once | INTRAMUSCULAR | Status: AC
  Administered 2024-02-03: 2 mg via INTRAVENOUS

## 2024-02-03 MED ORDER — SODIUM BICARBONATE 4.2 % IV SOLN
INTRAVENOUS | Status: AC
  Filled 2024-02-03: qty 10

## 2024-02-03 MED ORDER — EPHEDRINE 5 MG/ML INJ
INTRAVENOUS | Status: AC
  Filled 2024-02-03: qty 15

## 2024-02-03 MED ORDER — LIDOCAINE-EPINEPHRINE (PF) 1 %-1:200000 IJ SOLN
INTRAMUSCULAR | Status: AC
  Filled 2024-02-03: qty 30

## 2024-02-03 MED ORDER — ACETAMINOPHEN 10 MG/ML IV SOLN: 1000.0000 mg | Freq: Once | INTRAVENOUS | Status: DC | PRN

## 2024-02-03 MED ORDER — OXYCODONE HCL 5 MG/5ML PO SOLN: 5.0000 mg | Freq: Once | ORAL | Status: DC | PRN

## 2024-02-03 MED ORDER — FENTANYL CITRATE (PF) 100 MCG/2ML IJ SOLN: 25.0000 ug | INTRAMUSCULAR | Status: DC | PRN

## 2024-02-03 MED ORDER — ROPIVACAINE HCL 5 MG/ML IJ SOLN
INTRAMUSCULAR | Status: DC | PRN
  Administered 2024-02-03: 25 mL via PERINEURAL

## 2024-02-03 MED ORDER — ROPIVACAINE HCL 5 MG/ML IJ SOLN
INTRAMUSCULAR | Status: AC
  Filled 2024-02-03: qty 30

## 2024-02-03 MED ORDER — EPHEDRINE SULFATE (PRESSORS) 50 MG/ML IJ SOLN
INTRAMUSCULAR | Status: DC | PRN
  Administered 2024-02-03: 10 mg via INTRAVENOUS
  Administered 2024-02-03: 5 mg via INTRAVENOUS

## 2024-02-03 MED ORDER — LACTATED RINGERS IV SOLN: INTRAVENOUS | Status: DC

## 2024-02-03 MED ORDER — OXYCODONE HCL 5 MG PO TABS
5.0000 mg | ORAL_TABLET | ORAL | 0 refills | Status: AC | PRN
  Filled 2024-02-03: qty 42, 7d supply, fill #0

## 2024-02-03 MED ORDER — MIDAZOLAM HCL 2 MG/2ML IJ SOLN
INTRAMUSCULAR | Status: AC
  Filled 2024-02-03: qty 2

## 2024-02-03 MED ORDER — DOXYCYCLINE HYCLATE 100 MG PO CAPS
100.0000 mg | ORAL_CAPSULE | Freq: Two times a day (BID) | ORAL | 0 refills | Status: AC
  Filled 2024-02-03: qty 14, 7d supply, fill #0

## 2024-02-03 MED ORDER — GLYCOPYRROLATE PF 0.2 MG/ML IJ SOSY
PREFILLED_SYRINGE | INTRAMUSCULAR | Status: DC | PRN
  Administered 2024-02-03: .2 mg via INTRAVENOUS

## 2024-02-03 MED ORDER — ONDANSETRON HCL 4 MG PO TABS
4.0000 mg | ORAL_TABLET | Freq: Three times a day (TID) | ORAL | 1 refills | Status: AC | PRN
  Filled 2024-02-03: qty 15, 5d supply, fill #0

## 2024-02-03 MED ORDER — CEFAZOLIN SODIUM-DEXTROSE 2-4 GM/100ML-% IV SOLN
2.0000 g | INTRAVENOUS | Status: AC
Start: 2024-02-03 — End: 2024-02-03
  Administered 2024-02-03: 2 g via INTRAVENOUS

## 2024-02-03 MED ORDER — OXYCODONE HCL 5 MG PO TABS: 5.0000 mg | ORAL_TABLET | Freq: Once | ORAL | Status: DC | PRN

## 2024-02-03 MED ORDER — ONDANSETRON HCL 4 MG/2ML IJ SOLN: 4.0000 mg | Freq: Once | INTRAMUSCULAR | Status: DC | PRN

## 2024-02-03 MED ORDER — LIDOCAINE HCL (PF) 1 % IJ SOLN
INTRAMUSCULAR | Status: AC
  Filled 2024-02-03: qty 30

## 2024-02-03 MED ORDER — CEFAZOLIN SODIUM-DEXTROSE 2-4 GM/100ML-% IV SOLN
INTRAVENOUS | Status: AC
  Filled 2024-02-03: qty 100

## 2024-02-03 MED ORDER — PROPOFOL 10 MG/ML IV BOLUS
INTRAVENOUS | Status: DC | PRN
Start: 2024-02-03 — End: 2024-02-03
  Administered 2024-02-03: 30 mg via INTRAVENOUS

## 2024-02-03 MED ORDER — FENTANYL CITRATE (PF) 100 MCG/2ML IJ SOLN
50.0000 ug | Freq: Once | INTRAMUSCULAR | Status: AC
  Administered 2024-02-03: 50 ug via INTRAVENOUS

## 2024-02-03 SURGICAL SUPPLY — 53 items
ANCHOR FIBERLOCK SUSPENSION (Anchor) ×1 IMPLANT
BLADE CARPAL TUNNEL SNGL USE (BLADE) ×2 IMPLANT
BLADE SURG 15 STRL LF DISP TIS (BLADE) ×9 IMPLANT
BNDG ELASTIC 3INX 5YD STR LF (GAUZE/BANDAGES/DRESSINGS) ×3 IMPLANT
BNDG STRETCH GAUZE 3IN X12FT (GAUZE/BANDAGES/DRESSINGS) ×3 IMPLANT
BRUSH SCRUB EZ 4% CHG (MISCELLANEOUS) ×1 IMPLANT
BRUSH SCRUB EZ PLAIN DRY (MISCELLANEOUS) ×3 IMPLANT
CORD BIPOLAR FORCEPS 12FT (ELECTRODE) ×3 IMPLANT
COVER BACK TABLE 60X90IN (DRAPES) ×3 IMPLANT
CUFF TOURN SGL QUICK 18X4 (TOURNIQUET CUFF) ×1 IMPLANT
DRAIN PENROSE 12X.25 LTX STRL (MISCELLANEOUS) IMPLANT
DRAPE EXTREMITY T 121X128X90 (DISPOSABLE) ×3 IMPLANT
DRAPE OEC MINIVIEW 54X84 (DRAPES) ×1 IMPLANT
DRAPE SURG 17X23 STRL (DRAPES) ×3 IMPLANT
DRSG ADAPTIC 3X8 NADH LF (GAUZE/BANDAGES/DRESSINGS) ×1 IMPLANT
DRSG EMULSION OIL 3X3 NADH (GAUZE/BANDAGES/DRESSINGS) ×2 IMPLANT
FIBERLOOP 2 0 (SUTURE) ×1 IMPLANT
GAUZE 4X4 16PLY ~~LOC~~+RFID DBL (SPONGE) IMPLANT
GAUZE SPONGE 4X4 12PLY STRL (GAUZE/BANDAGES/DRESSINGS) ×1 IMPLANT
GAUZE XEROFORM 1X8 LF (GAUZE/BANDAGES/DRESSINGS) ×2 IMPLANT
GAUZE XEROFORM 5X9 LF (GAUZE/BANDAGES/DRESSINGS) ×1 IMPLANT
GLOVE BIOGEL M STRL SZ7.5 (GLOVE) IMPLANT
GLOVE SS BIOGEL STRL SZ 8 (GLOVE) ×4 IMPLANT
GOWN STRL REUS W/ TWL LRG LVL3 (GOWN DISPOSABLE) ×4 IMPLANT
GOWN STRL REUS W/ TWL XL LVL3 (GOWN DISPOSABLE) ×3 IMPLANT
KWIRE DBL .062X4 NSTRL (WIRE) ×1 IMPLANT
LOOP VASCLR MAXI BLUE 18IN ST (MISCELLANEOUS) IMPLANT
LOOPS VASCLR MAXI BLUE 18IN ST (MISCELLANEOUS) IMPLANT
NDL HYPO 22X1.5 SAFETY MO (MISCELLANEOUS) IMPLANT
NDL HYPO 25X1 1.5 SAFETY (NEEDLE) ×4 IMPLANT
NDL SAFETY ECLIPSE 18X1.5 (NEEDLE) ×2 IMPLANT
NEEDLE HYPO 22X1.5 SAFETY MO (MISCELLANEOUS) IMPLANT
NEEDLE HYPO 25X1 1.5 SAFETY (NEEDLE) IMPLANT
NS IRRIG 1000ML POUR BTL (IV SOLUTION) ×3 IMPLANT
PACK BASIN DAY SURGERY FS (CUSTOM PROCEDURE TRAY) ×3 IMPLANT
PAD ALCOHOL SWAB (MISCELLANEOUS) ×16 IMPLANT
PAD CAST 3X4 CTTN HI CHSV (CAST SUPPLIES) ×6 IMPLANT
PADDING CAST ABS COTTON 4X4 ST (CAST SUPPLIES) ×3 IMPLANT
PASSER SUT SWANSON 36MM LOOP (INSTRUMENTS) ×1 IMPLANT
PILLOW ARM CARTER ADULT (MISCELLANEOUS) ×1 IMPLANT
SHEET MEDIUM DRAPE 40X70 STRL (DRAPES) ×2 IMPLANT
SLING ARM FOAM STRAP LRG (SOFTGOODS) ×1 IMPLANT
SPLINT FIBERGLASS 4X30 (CAST SUPPLIES) ×1 IMPLANT
SPONGE T-LAP 18X18 ~~LOC~~+RFID (SPONGE) ×1 IMPLANT
STOCKINETTE 4X48 STRL (DRAPES) ×3 IMPLANT
SUT PROLENE 4 0 P 3 18 (SUTURE) ×1 IMPLANT
SUT PROLENE 4 0 PS 2 18 (SUTURE) ×4 IMPLANT
SUTURE FIBERWR 3-0 18 TAPR NDL (SUTURE) ×1 IMPLANT
SYR BULB EAR ULCER 3OZ GRN STR (SYRINGE) ×3 IMPLANT
SYR CONTROL 10ML LL (SYRINGE) ×4 IMPLANT
TOWEL GREEN STERILE FF (TOWEL DISPOSABLE) ×4 IMPLANT
TRAY DSU PREP LF (CUSTOM PROCEDURE TRAY) ×3 IMPLANT
UNDERPAD 30X36 HEAVY ABSORB (UNDERPADS AND DIAPERS) ×3 IMPLANT

## 2024-02-03 NOTE — Progress Notes (Signed)
 Orthopedic Tech Progress Note Patient Details:  ALYIA LACERTE 1961-06-19 [id number]  Day surgery RN called requesting a BLUE CARTER PILLOW from OR. So I went got ne and delivered it to day surgery front desk Patient ID: ZOEI AMISON, female   DOB: 1961/01/25, 64 y.o.   MRN: [id number]  Delanna LITTIE Pac 02/03/2024, 8:08 AM

## 2024-02-03 NOTE — Anesthesia Procedure Notes (Signed)
 Procedure Name: MAC Date/Time: 02/03/2024 7:45 AM  Performed by: Claudene Delon SQUIBB, CRNAPre-anesthesia Checklist: Patient identified, Emergency Drugs available, Suction available, Patient being monitored and Timeout performed Patient Re-evaluated:Patient Re-evaluated prior to induction Oxygen Delivery Method: Simple face mask Placement Confirmation: positive ETCO2 Dental Injury: Teeth and Oropharynx as per pre-operative assessment

## 2024-02-03 NOTE — Discharge Instructions (Addendum)
 We recommend that you to take vitamin C 1000 mg a day to promote healing. We also recommend that if you require  pain medicine that you take a stool softener to prevent constipation as most pain medicines will have constipation side effects. We recommend either Peri-Colace or Senokot and recommend that you also consider adding MiraLAX as well to prevent the constipation affects from pain medicine if you are required to use them. These medicines are over the counter and may be purchased at a local pharmacy. A cup of yogurt and a probiotic can also be helpful during the recovery process as the medicines can disrupt your intestinal environment.Keep bandage clean and dry.  Call for any problems.  No smoking.  Criteria for driving a car: you should be off your pain medicine for 7-8 hours, able to drive one handed(confident), thinking clearly and feeling able in your judgement to drive. Continue elevation as it will decrease swelling.  If instructed by MD move your fingers within the confines of the bandage/splint.  Use ice if instructed by your MD. Call immediately for any sudden loss of feeling in your hand/arm or change in functional abilities of the extremity.  No tylenol  before 1pm. Meds to be sent in by Dr Camella.  Regional Anesthesia Blocks  1. You may not be able to move or feel the blocked extremity after a regional anesthetic block. This may last may last from 3-48 hours after placement, but it will go away. The length of time depends on the medication injected and your individual response to the medication. As the nerves start to wake up, you may experience tingling as the movement and feeling returns to your extremity. If the numbness and inability to move your extremity has not gone away after 48 hours, please call your surgeon.   2. The extremity that is blocked will need to be protected until the numbness is gone and the strength has returned. Because you cannot feel it, you will need to take  extra care to avoid injury. Because it may be weak, you may have difficulty moving it or using it. You may not know what position it is in without looking at it while the block is in effect.  3. For blocks in the legs and feet, returning to weight bearing and walking needs to be done carefully. You will need to wait until the numbness is entirely gone and the strength has returned. You should be able to move your leg and foot normally before you try and bear weight or walk. You will need someone to be with you when you first try to ensure you do not fall and possibly risk injury.  4. Bruising and tenderness at the needle site are common side effects and will resolve in a few days.  5. Persistent numbness or new problems with movement should be communicated to the surgeon or the [facility] [phone] [facility] [phone]).   Post Anesthesia Home Care Instructions  Activity: Get plenty of rest for the remainder of the day. A responsible individual must stay with you for 24 hours following the procedure.  For the next 24 hours, DO NOT: -Drive a car -Advertising copywriter -Drink alcoholic beverages -Take any medication unless instructed by your physician -Make any legal decisions or sign important papers.  Meals: Start with liquid foods such as gelatin or soup. Progress to regular foods as tolerated. Avoid greasy, spicy, heavy foods. If nausea and/or vomiting occur, drink only clear liquids  until the nausea and/or vomiting subsides. Call your physician if vomiting continues.  Special Instructions/Symptoms: Your throat may feel dry or sore from the anesthesia or the breathing tube placed in your throat during surgery. If this causes discomfort, gargle with warm salt water. The discomfort should disappear within 24 hours.  If you had a scopolamine patch placed behind your ear for the management of post- operative nausea and/or vomiting:  1. The medication in the  patch is effective for 72 hours, after which it should be removed.  Wrap patch in a tissue and discard in the trash. Wash hands thoroughly with soap and water. 2. You may remove the patch earlier than 72 hours if you experience unpleasant side effects which may include dry mouth, dizziness or visual disturbances. 3. Avoid touching the patch. Wash your hands with soap and water after contact with the patch.

## 2024-02-03 NOTE — Op Note (Signed)
 Operative note 02/03/2024  Jody Duncan  Date of operation and dictation 02/03/2024   Preoperative diagnosis: Right carpal tunnel syndrome.  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  Operative procedure: #1 right carpal tunnel release.  #2 right thumb CMC arthroplasty/suspensioplasty with parallel suspension construct utilizing a fiber lock.  #3 right APL digastric tendon transfer to the APL and FCR tendons in a Welby tendon transfer technique.  #4 right wrist and hand stress radiography #5 APL tenodesis (shortening of wrist extensor at the wrist forearm level right upper extremity   Tourniquet time less than 90 minutes  Anesthesia Block with IV sedation  Estimated blood loss minimal  Indications for the procedure: Patient is 63 years of age she has had longstanding thumb arthritis with failure of conservative management including bracing injections and pharmacologic administration.  She presents for surgical correction.  She has a history of carpal tunnel symptomatology.  Operation detail: Patient was seen by myself and anesthesia and taken to the operative theater.  She was prepped and draped with Hibiclens scrub followed by 10-minute surgical Betadine scrub.  Outline marks were made visually timeout observed and sterile field secured.  Block was an excellent working fashion.  Preoperative antibiotics were given.  She underwent incision with 250 mm tourniquet control.  Dissection was carried down the carpal tunnel was identified through a 1 cm incision distal edge was released fat pad aggress nicely distal to proximal dissection was carried out under direct 4.5 loupe magnification to release the proximal leaflet.  I confirmed complete release.  The median nerve was hyperemic intact and there were no complicating features.  The area was irrigated and hemostasis secured.  Wound was closed with Prolene.  Following  this attention was turned towards the thumb.  Dorsal radial incision was made about the thumb CMC region.  Dissection was carried down EPB and APL interval created and tendon sheath incised.  Superficial radial nerve was carefully protected.  First dorsal compartment was released off of the dorsal ledge.  Following this I then excised the trapezium piecemeal in its entirety.  FCR tenolysis tendon synovectomy was accomplished.  Following this I then placed a  .062 K wire in 2 separate regions from dorsal to palmar exiting intra-articularly in line with the palmar beak ligament and from the outside edge of the metacarpal.  This created 2 drill holes in the metacarpal for suspension purposes.  Following this I then placed the fiber lock suspension system in the second metacarpal with standard technique.  The fiber tapes were then placed through the drill holes from intra-articular to the area about the metacarpal distally.  These were weaved underneath the abductor.  This was tightened down to my satisfaction making sure she was not over tensioned and had ample room for movement.  Following this I then harvested the digastric portion of the APL and placed a fiber lock loop around the tip.  This was then transferred to the FCR at the base and the APL proper.  Multiple figure-of-eight throws were placed to allow for a tendon transfer.  This was a Welby tendon transfer of the digastric portion of the APL to the APL proper and FCR.  Following this I performed a APL tenodesis this was a shortening of the wrist extensor at the wrist forearm level to prevent dorsal lateral escape.  She was excellently suspended.  X-ray confirmed excellent suspension good sound stability and no complicating features.  The  FCR was not overly tense.  I was quite pleased with all issues.  The counterincision for the abductor pollicis longus digastric portion harvest was closed with Prolene as was the main incision after copious  irrigation.  Refill was excellent.  X-rays look great and were given to the patient and also documented for our purposes.  I discussed all issues with her husband who is a physician.  Discharge medicines include oxycodone , Robaxin , Zofran  and doxycycline .  They have my cell phone for any problems.  Standard postop protocol be adhered to.  It was a pleasure to see and treat the patient today.  Jody Mussel MD  02/03/2024

## 2024-02-03 NOTE — Progress Notes (Signed)
Assisted Dr. Richardson Landry with right, supraclavicular, ultrasound guided block. Side rails up, monitors on throughout procedure. See vital signs in flow sheet. Tolerated Procedure well.

## 2024-02-03 NOTE — Anesthesia Procedure Notes (Signed)
 Anesthesia Regional Block: Supraclavicular block   Pre-Anesthetic Checklist: , timeout performed,  Correct Patient, Correct Site, Correct Laterality,  Correct Procedure, Correct Position, site marked,  Risks and benefits discussed,  Surgical consent,  Pre-op evaluation,  At surgeon's request and post-op pain management  Laterality: Upper and Right  Prep: chloraprep       Needles:  Injection technique: Single-shot  Needle Type: Echogenic Needle     Needle Length: 5cm  Needle Gauge: 21     Additional Needles:   Procedures:,,,, ultrasound used (permanent image in chart),,     Nerve Stimulator or Paresthesia:   Additional Responses:  Block tested.  Patient tolerated procedure well Narrative:  Start time: 02/03/2024 7:10 AM End time: 02/03/2024 7:17 AM Injection made incrementally with aspirations every 5 mL.  Performed by: Personally  Anesthesiologist: Jefm Garnette LABOR, MD  Additional Notes: Block tested. Patient tolerated procedure well.

## 2024-02-03 NOTE — Transfer of Care (Signed)
 Immediate Anesthesia Transfer of Care Note  Patient: EDEE NIFONG  Procedure(s) Performed: CARPOMETACARPAL (CMC) FUSION OF THUMB (Right: Thumb) CARPAL TUNNEL RELEASE (Right: Wrist) TRANSFER, TENDON (Right: Wrist)  Patient Location: PACU  Anesthesia Type:MAC  Level of Consciousness: awake, alert , oriented, and patient cooperative  Airway & Oxygen Therapy: Patient Spontanous Breathing and Patient connected to face mask oxygen  Post-op Assessment: Report given to RN and Post -op Vital signs reviewed and stable  Post vital signs: Reviewed and stable  Last Vitals:  Vitals Value Taken Time  BP 110/70 02/03/24 09:31  Temp    Pulse 56 02/03/24 09:32  Resp 16 02/03/24 09:32  SpO2 98 % 02/03/24 09:32  Vitals shown include unfiled device data.  Last Pain:  Vitals:   02/03/24 0635  TempSrc: Temporal  PainSc: 0-No pain      Patients Stated Pain Goal: 3 (02/03/24 9364)  Complications: No notable events documented.

## 2024-02-03 NOTE — Anesthesia Postprocedure Evaluation (Signed)
 Anesthesia Post Note  Patient: Jody Duncan  Procedure(s) Performed: CARPOMETACARPAL (CMC) FUSION OF THUMB (Right: Thumb) CARPAL TUNNEL RELEASE (Right: Wrist) TRANSFER, TENDON (Right: Wrist)     Patient location during evaluation: PACU Anesthesia Type: Regional Level of consciousness: awake and alert Pain management: pain level controlled Vital Signs Assessment: post-procedure vital signs reviewed and stable Respiratory status: spontaneous breathing, nonlabored ventilation, respiratory function stable and patient connected to nasal cannula oxygen Cardiovascular status: stable and blood pressure returned to baseline Postop Assessment: no apparent nausea or vomiting Anesthetic complications: no   No notable events documented.  Last Vitals:  Vitals:   02/03/24 0952 02/03/24 1008  BP:  115/79  Pulse: (!) 50 (!) 46  Resp: 18 20  Temp:  36.5 C  SpO2: 99% 96%    Last Pain:  Vitals:   02/03/24 1008  TempSrc: Temporal  PainSc: 0-No pain                 Garnette [name] Gab

## 2024-02-03 NOTE — H&P (Signed)
 Jody Duncan is an 63 y.o. female.   Chief Complaint: The patient presents for right CTR and Right CMC Aplasty HPI: Patient presents for evaluation and treatment of the of their upper extremity predicament. The patient denies neck, back, chest or  abdominal pain. The patient notes that they have no lower extremity problems. The patients primary complaint is noted. We are planning surgical care pathway for the upper extremity.   Past Medical History:  Diagnosis Date   Allergy    dust, animal hair   Glaucoma     Past Surgical History:  Procedure Laterality Date   APPENDECTOMY     WISDOM TOOTH EXTRACTION      Family History  Problem Relation Age of Onset   Breast cancer Neg Hx    Colon cancer Neg Hx    Esophageal cancer Neg Hx    Stomach cancer Neg Hx    Rectal cancer Neg Hx    Social History:  reports that she has never smoked. She has never been exposed to tobacco smoke. She has never used smokeless tobacco. She reports current alcohol  use. She reports that she does not use drugs.  Allergies:  Allergies  Allergen Reactions   Dust Mite Extract Other (See Comments)   Mite (D. Farinae)     Facility-Administered Medications Prior to Admission  Medication Dose Route Frequency Provider Last Rate Last Admin   0.9 %  sodium chloride  infusion  500 mL Intravenous Once Pyrtle, Gordy HERO, MD       Medications Prior to Admission  Medication Sig Dispense Refill   BIOTIN PO Take by mouth daily.     Cholecalciferol (VITAMIN D3 PO) daily.     hydrochlorothiazide  (HYDRODIURIL ) 25 MG tablet Take 25 mg by mouth daily as needed.  6   hydrochlorothiazide  (HYDRODIURIL ) 25 MG tablet Take 1 tablet (25 mg total) by mouth daily in the morning as needed. 90 tablet 4   ibuprofen  (ADVIL ) 800 MG tablet TAKE 1 TABLET BY MOUTH 3 TIMES DAILY AS NEEDED 90 tablet PRN   latanoprost  (XALATAN ) 0.005 % ophthalmic solution Place 1 drop into both eyes Nightly. 2.5 mL 11   MAGNESIUM PO Take by mouth daily.      Omega-3 Fatty Acids (OMEGA 3 PO) Take by mouth daily.     prednisoLONE  acetate (PRED FORTE ) 1 % ophthalmic suspension Place 1 drop into both eyes 4 (four) times daily for 5 days. 5 mL 0   Progesterone Micronized (PROGESTERONE COMPOUNDING KIT TD) Place onto the skin. Applies to skin twice daily     Pyridoxine HCl (VITAMIN B-6 PO) Take by mouth daily.     TURMERIC PO Take by mouth daily.     VITAMIN E PO Take by mouth daily.     hydrochlorothiazide  (HYDRODIURIL ) 25 MG tablet TAKE 1 TABLET BY MOUTH ONCE DAILY AS NEEDED 90 tablet 2   latanoprost  (XALATAN ) 0.005 % ophthalmic solution Place 1 drop into both eyes at bedtime. 7.5 mL 3   terbinafine  (LAMISIL ) 250 MG tablet Take 1 tablet (250 mg total) by mouth once every other month 7 tablet 0   terbinafine  (LAMISIL ) 250 MG tablet Take 1 tablet (250 mg total) by mouth daily for 7 days every other month. (Patient not taking: Reported on 06/03/2023) 30 tablet 0   terbinafine  (LAMISIL ) 250 MG tablet Take 1 tablet (250 mg total) by mouth daily. 30 tablet 2   triamcinolone  cream (KENALOG ) 0.1 % Apply 1 Application topically 2 (two) times daily to areas fo  rash for 1-2 weeks until completely smooth. 80 g 0    No results found for this or any previous visit (from the past 48 hours). No results found.  Review of Systems  Height 5' 4 (1.626 m), weight 61.2 kg, last menstrual period 01/20/2012. Physical Exam The patient is alert and oriented in no acute distress. The patient complains of pain in the affected upper extremity.  The patient is noted to have a normal HEENT exam. Lung fields show equal chest expansion and no shortness of breath. Abdomen exam is nontender without distention. Lower extremity examination does not show any fracture dislocation or blood clot symptoms. Pelvis is stable and the neck and back are stable and nontender.   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 The risk and benefits of surgery to include risk of bleeding, infection, anesthesia,  damage to normal structures and failure of the surgery to accomplish its intended goals of relieving symptoms and restoring function have been discussed in detail. With this in mind we plan to proceed. I have specifically discussed with the patient the pre-and postoperative regime and the dos and don'ts and risk and benefits in great detail. Risk and benefits of surgery also include risk of dystrophy(CRPS), chronic nerve pain, failure of the healing process to go onto completion and other inherent risks of surgery The relavent the pathophysiology of the disease/injury process, as well as the alternatives for treatment and postoperative course of action has been discussed in great detail with the patient who desires to proceed.  We will do everything in our power to help you (the patient) restore function to the upper extremity. It is a pleasure to see this patient today.     Elsie CHRISTELLA Mussel III, MD 02/03/2024, 6:36 AM

## 2024-02-04 ENCOUNTER — Encounter (HOSPITAL_BASED_OUTPATIENT_CLINIC_OR_DEPARTMENT_OTHER): Payer: Self-pay | Admitting: Orthopedic Surgery

## 2024-02-16 DIAGNOSIS — Z4789 Encounter for other orthopedic aftercare: Secondary | ICD-10-CM | POA: Diagnosis not present

## 2024-02-19 ENCOUNTER — Other Ambulatory Visit (HOSPITAL_COMMUNITY): Payer: Self-pay

## 2024-02-20 DIAGNOSIS — Z4889 Encounter for other specified surgical aftercare: Secondary | ICD-10-CM | POA: Diagnosis not present

## 2024-02-20 DIAGNOSIS — Z4789 Encounter for other orthopedic aftercare: Secondary | ICD-10-CM | POA: Diagnosis not present

## 2024-02-24 DIAGNOSIS — H40033 Anatomical narrow angle, bilateral: Secondary | ICD-10-CM | POA: Diagnosis not present

## 2024-02-24 DIAGNOSIS — H2513 Age-related nuclear cataract, bilateral: Secondary | ICD-10-CM | POA: Diagnosis not present

## 2024-02-24 DIAGNOSIS — H402211 Chronic angle-closure glaucoma, right eye, mild stage: Secondary | ICD-10-CM | POA: Diagnosis not present

## 2024-03-01 DIAGNOSIS — M25642 Stiffness of left hand, not elsewhere classified: Secondary | ICD-10-CM | POA: Diagnosis not present

## 2024-03-01 DIAGNOSIS — M1811 Unilateral primary osteoarthritis of first carpometacarpal joint, right hand: Secondary | ICD-10-CM | POA: Diagnosis not present

## 2024-03-03 DIAGNOSIS — Z4789 Encounter for other orthopedic aftercare: Secondary | ICD-10-CM | POA: Diagnosis not present

## 2024-03-03 DIAGNOSIS — M19041 Primary osteoarthritis, right hand: Secondary | ICD-10-CM | POA: Diagnosis not present

## 2024-03-05 DIAGNOSIS — M25642 Stiffness of left hand, not elsewhere classified: Secondary | ICD-10-CM | POA: Diagnosis not present

## 2024-03-09 DIAGNOSIS — M25642 Stiffness of left hand, not elsewhere classified: Secondary | ICD-10-CM | POA: Diagnosis not present

## 2024-03-11 DIAGNOSIS — M25642 Stiffness of left hand, not elsewhere classified: Secondary | ICD-10-CM | POA: Diagnosis not present

## 2024-03-16 DIAGNOSIS — M25642 Stiffness of left hand, not elsewhere classified: Secondary | ICD-10-CM | POA: Diagnosis not present

## 2024-03-18 DIAGNOSIS — M25642 Stiffness of left hand, not elsewhere classified: Secondary | ICD-10-CM | POA: Diagnosis not present

## 2024-03-23 ENCOUNTER — Other Ambulatory Visit (HOSPITAL_COMMUNITY): Payer: Self-pay

## 2024-03-23 DIAGNOSIS — M25642 Stiffness of left hand, not elsewhere classified: Secondary | ICD-10-CM | POA: Diagnosis not present

## 2024-03-30 DIAGNOSIS — M25642 Stiffness of left hand, not elsewhere classified: Secondary | ICD-10-CM | POA: Diagnosis not present

## 2024-03-31 DIAGNOSIS — G5601 Carpal tunnel syndrome, right upper limb: Secondary | ICD-10-CM | POA: Diagnosis not present

## 2024-03-31 DIAGNOSIS — M1811 Unilateral primary osteoarthritis of first carpometacarpal joint, right hand: Secondary | ICD-10-CM | POA: Diagnosis not present

## 2024-03-31 DIAGNOSIS — Z4789 Encounter for other orthopedic aftercare: Secondary | ICD-10-CM | POA: Diagnosis not present

## 2024-04-08 DIAGNOSIS — M25642 Stiffness of left hand, not elsewhere classified: Secondary | ICD-10-CM | POA: Diagnosis not present

## 2024-04-15 DIAGNOSIS — M25642 Stiffness of left hand, not elsewhere classified: Secondary | ICD-10-CM | POA: Diagnosis not present

## 2024-04-21 DIAGNOSIS — M8580 Other specified disorders of bone density and structure, unspecified site: Secondary | ICD-10-CM | POA: Diagnosis not present

## 2024-04-21 DIAGNOSIS — E785 Hyperlipidemia, unspecified: Secondary | ICD-10-CM | POA: Diagnosis not present

## 2024-04-21 DIAGNOSIS — Z0189 Encounter for other specified special examinations: Secondary | ICD-10-CM | POA: Diagnosis not present

## 2024-04-21 DIAGNOSIS — R7301 Impaired fasting glucose: Secondary | ICD-10-CM | POA: Diagnosis not present

## 2024-04-22 DIAGNOSIS — M25642 Stiffness of left hand, not elsewhere classified: Secondary | ICD-10-CM | POA: Diagnosis not present

## 2024-04-26 DIAGNOSIS — E785 Hyperlipidemia, unspecified: Secondary | ICD-10-CM | POA: Diagnosis not present

## 2024-04-26 DIAGNOSIS — N951 Menopausal and female climacteric states: Secondary | ICD-10-CM | POA: Diagnosis not present

## 2024-04-26 DIAGNOSIS — Z1331 Encounter for screening for depression: Secondary | ICD-10-CM | POA: Diagnosis not present

## 2024-04-26 DIAGNOSIS — M19031 Primary osteoarthritis, right wrist: Secondary | ICD-10-CM | POA: Diagnosis not present

## 2024-04-26 DIAGNOSIS — M8580 Other specified disorders of bone density and structure, unspecified site: Secondary | ICD-10-CM | POA: Diagnosis not present

## 2024-04-26 DIAGNOSIS — Z1339 Encounter for screening examination for other mental health and behavioral disorders: Secondary | ICD-10-CM | POA: Diagnosis not present

## 2024-04-26 DIAGNOSIS — Z0000 Encounter for general adult medical examination without abnormal findings: Secondary | ICD-10-CM | POA: Diagnosis not present

## 2024-04-26 DIAGNOSIS — H409 Unspecified glaucoma: Secondary | ICD-10-CM | POA: Diagnosis not present

## 2024-04-26 DIAGNOSIS — R82998 Other abnormal findings in urine: Secondary | ICD-10-CM | POA: Diagnosis not present

## 2024-04-26 DIAGNOSIS — Z23 Encounter for immunization: Secondary | ICD-10-CM | POA: Diagnosis not present

## 2024-04-26 DIAGNOSIS — R7301 Impaired fasting glucose: Secondary | ICD-10-CM | POA: Diagnosis not present

## 2024-04-30 ENCOUNTER — Other Ambulatory Visit: Payer: Self-pay

## 2024-05-06 DIAGNOSIS — M25642 Stiffness of left hand, not elsewhere classified: Secondary | ICD-10-CM | POA: Diagnosis not present

## 2024-05-12 ENCOUNTER — Other Ambulatory Visit (HOSPITAL_COMMUNITY): Payer: Self-pay

## 2024-05-12 DIAGNOSIS — Z4789 Encounter for other orthopedic aftercare: Secondary | ICD-10-CM | POA: Diagnosis not present

## 2024-05-12 DIAGNOSIS — M1811 Unilateral primary osteoarthritis of first carpometacarpal joint, right hand: Secondary | ICD-10-CM | POA: Diagnosis not present

## 2024-05-12 DIAGNOSIS — G5601 Carpal tunnel syndrome, right upper limb: Secondary | ICD-10-CM | POA: Diagnosis not present

## 2024-05-18 DIAGNOSIS — M25642 Stiffness of left hand, not elsewhere classified: Secondary | ICD-10-CM | POA: Diagnosis not present

## 2024-05-20 ENCOUNTER — Other Ambulatory Visit (HOSPITAL_COMMUNITY): Payer: Self-pay

## 2024-05-20 MED ORDER — VALACYCLOVIR HCL 1 G PO TABS
1000.0000 mg | ORAL_TABLET | Freq: Two times a day (BID) | ORAL | 2 refills | Status: AC
  Filled 2024-05-20: qty 30, 15d supply, fill #0

## 2024-06-02 DIAGNOSIS — M25642 Stiffness of left hand, not elsewhere classified: Secondary | ICD-10-CM | POA: Diagnosis not present

## 2024-06-09 ENCOUNTER — Other Ambulatory Visit (HOSPITAL_COMMUNITY): Payer: Self-pay

## 2024-07-16 ENCOUNTER — Other Ambulatory Visit (HOSPITAL_COMMUNITY): Payer: Self-pay

## 2024-07-16 MED ORDER — HYDROCHLOROTHIAZIDE 25 MG PO TABS
25.0000 mg | ORAL_TABLET | Freq: Every day | ORAL | 3 refills | Status: AC | PRN
  Filled 2024-07-16 – 2024-07-23 (×4): qty 90, 90d supply, fill #0

## 2024-07-19 ENCOUNTER — Other Ambulatory Visit (HOSPITAL_COMMUNITY): Payer: Self-pay

## 2024-07-23 ENCOUNTER — Other Ambulatory Visit (HOSPITAL_COMMUNITY): Payer: Self-pay

## 2024-07-27 ENCOUNTER — Telehealth: Payer: Self-pay

## 2024-07-27 DIAGNOSIS — I8393 Asymptomatic varicose veins of bilateral lower extremities: Secondary | ICD-10-CM

## 2024-07-27 NOTE — Telephone Encounter (Signed)
 Pt called with c/o RLE worsening varicose veins and intermittent pain for the last few months. She had sclerotherapy a year ago and felt the first time we did it was more successful than this most recent time. She is very active. She notices the discomfort more at the end of the day. She has been advised to try going back in her compression hose and seeing if that helps and she has been scheduled for a RLE reflux study and to see APP.

## 2024-08-06 ENCOUNTER — Other Ambulatory Visit: Payer: Self-pay | Admitting: Obstetrics and Gynecology

## 2024-08-06 DIAGNOSIS — Z1231 Encounter for screening mammogram for malignant neoplasm of breast: Secondary | ICD-10-CM

## 2024-09-02 ENCOUNTER — Ambulatory Visit (HOSPITAL_COMMUNITY)

## 2024-09-15 ENCOUNTER — Ambulatory Visit

## 2024-09-16 ENCOUNTER — Ambulatory Visit
